# Patient Record
Sex: Female | Born: 2000
Health system: Southern US, Community
[De-identification: ages and names within clinical notes are randomized; demographics above are authoritative.]

## PROBLEM LIST (undated history)

## (undated) DIAGNOSIS — F32A Depression, unspecified: Secondary | ICD-10-CM

## (undated) DIAGNOSIS — J302 Other seasonal allergic rhinitis: Secondary | ICD-10-CM

## (undated) DIAGNOSIS — Z9889 Other specified postprocedural states: Secondary | ICD-10-CM

## (undated) DIAGNOSIS — F329 Major depressive disorder, single episode, unspecified: Secondary | ICD-10-CM

## (undated) DIAGNOSIS — J45909 Unspecified asthma, uncomplicated: Secondary | ICD-10-CM

## (undated) DIAGNOSIS — R112 Nausea with vomiting, unspecified: Secondary | ICD-10-CM

## (undated) DIAGNOSIS — F419 Anxiety disorder, unspecified: Secondary | ICD-10-CM

## (undated) DIAGNOSIS — G43909 Migraine, unspecified, not intractable, without status migrainosus: Secondary | ICD-10-CM

## (undated) HISTORY — PX: TONSILECTOMY, ADENOIDECTOMY, BILATERAL MYRINGOTOMY AND TUBES: SHX2538

## (undated) HISTORY — PX: OTHER SURGICAL HISTORY: SHX169

## (undated) HISTORY — PX: FOOT SURGERY: SHX648

## (undated) HISTORY — PX: TONSILLECTOMY AND ADENOIDECTOMY: SHX28

---
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
## 2019-02-27 ENCOUNTER — Other Ambulatory Visit: Payer: Self-pay | Admitting: Psychiatry

## 2019-02-27 DIAGNOSIS — F5082 Avoidant/restrictive food intake disorder: Secondary | ICD-10-CM

## 2019-02-27 DIAGNOSIS — F410 Panic disorder [episodic paroxysmal anxiety] without agoraphobia: Secondary | ICD-10-CM

## 2019-02-27 DIAGNOSIS — F902 Attention-deficit hyperactivity disorder, combined type: Secondary | ICD-10-CM

## 2019-02-27 DIAGNOSIS — F4312 Post-traumatic stress disorder, chronic: Secondary | ICD-10-CM

## 2019-02-28 NOTE — Telephone Encounter (Signed)
Now 3 months since last appointment needing 1 additional refill Pristiq 50 mg every morning #30 no refill for the interim to next appointment sent to [facility] [address] along with a renewal of the inhaler from 7 months ago provided at a time she was moving to [place] to have new providers, with no concern for inappropriate use or contraindication.

## 2019-02-28 NOTE — Telephone Encounter (Signed)
Has appt 06/11  Didn't think you would be refilling inhaler?

## 2019-03-04 ENCOUNTER — Other Ambulatory Visit: Payer: Self-pay

## 2019-03-04 ENCOUNTER — Encounter: Payer: Self-pay | Admitting: Emergency Medicine

## 2019-03-04 ENCOUNTER — Emergency Department
Admission: EM | Admit: 2019-03-04 | Discharge: 2019-03-04 | Disposition: A | Discharge status: Home / Self Care | Payer: 59 | Attending: Emergency Medicine | Admitting: Emergency Medicine

## 2019-03-04 DIAGNOSIS — Z79899 Other long term (current) drug therapy: Secondary | ICD-10-CM | POA: Diagnosis not present

## 2019-03-04 DIAGNOSIS — R112 Nausea with vomiting, unspecified: Secondary | ICD-10-CM | POA: Insufficient documentation

## 2019-03-04 DIAGNOSIS — F17290 Nicotine dependence, other tobacco product, uncomplicated: Secondary | ICD-10-CM | POA: Diagnosis not present

## 2019-03-04 DIAGNOSIS — R197 Diarrhea, unspecified: Secondary | ICD-10-CM | POA: Insufficient documentation

## 2019-03-04 DIAGNOSIS — J45909 Unspecified asthma, uncomplicated: Secondary | ICD-10-CM | POA: Insufficient documentation

## 2019-03-04 HISTORY — DX: Unspecified asthma, uncomplicated: J45.909

## 2019-03-04 LAB — COMPREHENSIVE METABOLIC PANEL
ALT: 17 U/L (ref 0–44)
AST: 29 U/L (ref 15–41)
Albumin: 5.3 g/dL — ABNORMAL HIGH (ref 3.5–5.0)
Alkaline Phosphatase: 92 U/L (ref 38–126)
Anion gap: 13 (ref 5–15)
BUN: 11 mg/dL (ref 6–20)
CO2: 20 mmol/L — ABNORMAL LOW (ref 22–32)
Calcium: 9.6 mg/dL (ref 8.9–10.3)
Chloride: 106 mmol/L (ref 98–111)
Creatinine, Ser: 0.85 mg/dL (ref 0.44–1.00)
GFR calc Af Amer: >60 mL/min (ref >60)
GFR calc non Af Amer: >60 mL/min (ref >60)
Glucose, Bld: 143 mg/dL — ABNORMAL HIGH (ref 70–99)
Potassium: 3.4 mmol/L — ABNORMAL LOW (ref 3.5–5.1)
Sodium: 139 mmol/L (ref 135–145)
Total Bilirubin: 1.2 mg/dL (ref 0.3–1.2)
Total Protein: 8.4 g/dL — ABNORMAL HIGH (ref 6.5–8.1)

## 2019-03-04 LAB — CBC
HCT: 45.8 % (ref 36.0–46.0)
Hemoglobin: 15.3 g/dL — ABNORMAL HIGH (ref 12.0–15.0)
MCH: 31.6 pg (ref 26.0–34.0)
MCHC: 33.4 g/dL (ref 30.0–36.0)
MCV: 94.6 fL (ref 80.0–100.0)
Platelets: 318 10*3/uL (ref 150–400)
RBC: 4.84 MIL/uL (ref 3.87–5.11)
RDW: 12 % (ref 11.5–15.5)
WBC: 12.7 10*3/uL — ABNORMAL HIGH (ref 4.0–10.5)
nRBC: 0 % (ref 0.0–0.2)

## 2019-03-04 LAB — LIPASE, BLOOD: Lipase: 32 U/L (ref 11–51)

## 2019-03-04 MED ORDER — ONDANSETRON HCL 4 MG/2ML IJ SOLN
4.0000 mg | Freq: Once | INTRAMUSCULAR | Status: AC | PRN
  Administered 2019-03-04: 4 mg via INTRAVENOUS
  Filled 2019-03-04: qty 2

## 2019-03-04 MED ORDER — ONDANSETRON 4 MG PO TBDP: 4.0000 mg | ORAL_TABLET | Freq: Three times a day (TID) | ORAL | 0 refills | Status: DC | PRN

## 2019-03-04 MED ORDER — PROMETHAZINE HCL 25 MG/ML IJ SOLN
25.0000 mg | Freq: Once | INTRAMUSCULAR | Status: AC
  Administered 2019-03-04: 25 mg via INTRAVENOUS

## 2019-03-04 MED ORDER — SODIUM CHLORIDE 0.9 % IV SOLN
Freq: Once | INTRAVENOUS | Status: AC
  Administered 2019-03-04: 18:00:00 via INTRAVENOUS

## 2019-03-04 NOTE — ED Provider Notes (Signed)
[facility] EMERGENCY DEPARTMENT Provider Note   CSN: [id number] Arrival date & time: 03/04/19  1624    History   Chief Complaint Chief Complaint  Patient presents with  . Emesis    HPI Ashley Chen is a 18 y.o. female.     18 year old female who presents to the emergency department for treatment and evaluation of vomiting and diarrhea which is triggering her anxiety. Symptoms started this morning. No alleviating measures attempted.     Past Medical History:  Diagnosis Date  . Anxiety   . Asthma   . Depression   . Migraine   . Seasonal allergies     Patient Active Problem List   Diagnosis Date Noted  . Panic disorder 09/06/2018  . 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 Attention deficit hyperactivity disorder (ADHD), combined type, 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  . Moderate malnutrition (HCC) 10/12/2017  . Needle phobia 10/12/2017  . Chronic post-traumatic stress disorder 06/11/2015  . 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 Menorrhagia 08/14/2014    Past Surgical History:  Procedure Laterality Date  . FOOT SURGERY    . labial surgery    . TONSILECTOMY, ADENOIDECTOMY, BILATERAL MYRINGOTOMY AND TUBES     before age 89  . TONSILLECTOMY AND ADENOIDECTOMY    . umblical hernia repair     before age 89     OB History    Gravida  0   Para  0   Term  0   Preterm  0   AB  0   Living  0     SAB  0   TAB  0   Ectopic  0   Multiple  0   Live Births  0            Home Medications    Prior to Admission medications   Medication Sig Start Date End Date Taking? Authorizing Provider  albuterol (VENTOLIN HFA) 108 (90 Base) MCG/ACT inhaler INHALE 1 TO 2 PUFFS INTO THE LUNGS EVERY 6 HOURS AS NEEDED FOR WHEEZING OR SHORTNESS OF BREATH. Patient taking differently: Inhale 1-2 puffs into the lungs every 6 (six) hours as needed for wheezing or shortness of breath.  02/28/19  Yes Chauncey MannJennings, Glenn E, MD  clonazePAM  (KLONOPIN) 0.5 MG tablet Take 1 tablet (0.5 mg total) by mouth 2 (two) times daily as needed for anxiety (Panic). 12/06/18  Yes Chauncey MannJennings, Glenn E, MD  desvenlafaxine (PRISTIQ) 50 MG 24 hr tablet TAKE 1 TABLET BY MOUTH EVERY DAY Patient taking differently: Take 50 mg by mouth daily.  02/28/19  Yes Chauncey MannJennings, Glenn E, MD  EPINEPHrine (EPIPEN 2-PAK) 0.3 mg/0.3 mL IJ SOAJ injection Inject 0.3 mg into the muscle once as needed (severe allergic reaction).   Yes [provider]  ibuprofen (ADVIL,MOTRIN) 200 MG tablet Take 600 mg by mouth every 6 (six) hours as needed for moderate pain or cramping.   Yes [provider]  levonorgestrel (MIRENA) 20 MCG/24HR IUD 1 each by Intrauterine route once. Implanted March or April 2016   Yes [provider]  lisdexamfetamine (VYVANSE) 50 MG capsule Take 1 capsule (50 mg total) by mouth daily for 30 days. 02/04/19 03/06/19 Yes Chauncey MannJennings, Glenn E, MD  mirtazapine (REMERON) 30 MG tablet Take 1 tablet (30 mg total) by mouth at bedtime. 12/06/18  Yes Chauncey MannJennings, Glenn E, MD  lisdexamfetamine (VYVANSE) 50 MG capsule Take 1 capsule (50 mg total) by mouth daily for 30 days. 12/06/18 01/05/19  Chauncey MannJennings, Glenn E, MD  lisdexamfetamine (VYVANSE) 50 MG capsule Take 1 capsule (50 mg total) by mouth daily for 30 days. 01/05/19 02/04/19  Delight Hoh, MD  ondansetron (ZOFRAN-ODT) 4 MG disintegrating tablet Take 1 tablet (4 mg total) by mouth every 8 (eight) hours as needed for nausea or vomiting. 03/04/19   Victorino Dike, FNP    Family History Family History  Problem Relation Age of Onset  . Drug abuse Mother   . Anxiety disorder Mother   . ADD / ADHD Brother   . Mental retardation Brother   . Depression Paternal Uncle   . Alcohol abuse Paternal Uncle     Social History Social History   Tobacco Use  . Smoking status: Current Every Day Smoker    Types: E-cigarettes  . Smokeless tobacco: Never Used  Substance Use Topics  . Alcohol use: Yes     Alcohol/week: 3.0 standard drinks    Types: 3 Shots of liquor per week    Comment: once a month  . Drug use: Yes    Types: Marijuana    Comment: xanax last use in April 2018; marijuana use weekly as of 09/16/17     Allergies   Horse-derived products; Lactose intolerance (gi); and Other   Review of Systems Review of Systems  Constitutional: Positive for appetite change and chills. Negative for fatigue and fever.  HENT: Negative for sore throat.   Eyes: Negative.   Respiratory: Negative.   Cardiovascular: Negative for chest pain.  Gastrointestinal: Positive for diarrhea and vomiting. Negative for abdominal pain.  Genitourinary: Negative for decreased urine volume, dysuria, flank pain and frequency.  Musculoskeletal: Negative.   Skin: Positive for pallor.  Neurological: Negative for dizziness, speech difficulty and headaches.  Psychiatric/Behavioral: Negative for suicidal ideas. The patient is nervous/anxious.      Physical Exam Updated Vital Signs BP 139/66   Pulse 95   Temp (!) 97.5 F (36.4 C) (Oral)   Resp 20   Ht 5\' 2"  (1.575 m)   Wt 47.6 kg   SpO2 100%   BMI 19.20 kg/m   Physical Exam Constitutional:      General: She is not in acute distress.    Appearance: She is ill-appearing.  HENT:     Head: Normocephalic.     Nose: No rhinorrhea.     Mouth/Throat:     Mouth: Mucous membranes are moist.  Neck:     Musculoskeletal: Neck supple.  Cardiovascular:     Rate and Rhythm: Normal rate.  Pulmonary:     Breath sounds: Normal breath sounds.  Abdominal:     General: Abdomen is flat.     Tenderness: There is no abdominal tenderness. There is no guarding or rebound.  Skin:    General: Skin is warm and dry.     Capillary Refill: Capillary refill takes less than 2 seconds.     Coloration: Skin is pale.  Neurological:     General: No focal deficit present.  Psychiatric:        Mood and Affect: Mood is anxious.        Speech: Speech normal.        Behavior:  Behavior is cooperative.        Cognition and Memory: Cognition normal.        Judgment: Judgment normal.      ED Treatments / Results  Labs (all labs ordered are listed, but only abnormal results are displayed) Labs Reviewed  COMPREHENSIVE METABOLIC PANEL - Abnormal; Notable for the following  components:      Result Value   Potassium 3.4 (*)    CO2 20 (*)    Glucose, Bld 143 (*)    Total Protein 8.4 (*)    Albumin 5.3 (*)    All other components within normal limits  CBC - Abnormal; Notable for the following components:   WBC 12.7 (*)    Hemoglobin 15.3 (*)    All other components within normal limits  LIPASE, BLOOD  URINALYSIS, COMPLETE (UACMP) WITH MICROSCOPIC  POC URINE PREG, ED    EKG None  Radiology No results found.  Procedures Procedures (including critical care time)  Medications Ordered in ED Medications  ondansetron (ZOFRAN) injection 4 mg (4 mg Intravenous Given 03/04/19 1639)  promethazine (PHENERGAN) injection 25 mg (25 mg Intravenous Given 03/04/19 1731)  0.9 %  sodium chloride infusion ( Intravenous Stopped 03/04/19 1857)     Initial Impression / Assessment and Plan / ED Course  I have reviewed the triage vital signs and the nursing notes.  Pertinent labs & imaging results that were available during my care of the patient were reviewed by me and considered in my medical decision making (see chart for details).  18 year old female presenting to the emergency department for treatment and evaluation of vomiting and diarrhea.  Symptoms started suddenly this morning.  No one else in the home is ill.  She also states that because of the vomiting and diarrhea her anxiety is extremely bad.  She has been unable to take her anxiety medications due to nausea and vomiting.  She will be given some IV fluids as well as some Zofran and will hopefully be able to tolerate her oral anxiety medications.  Patient continues to complain of nausea and has had one episode of  nonbilious emesis since initial exam.  IV Phenergan will be given.  She remains extremely anxious and is hyperventilating.  She was encouraged to concentrate on breathing and was offered distraction with TV which she declined.  IV fluids infusing.  Patient's skin tone has improved and she is no longer as pale.  She continues to complain of some nausea but states that she has improved some what.  She has not vomited since I was last in the room.  She was able to take her own home anxiety meds.  If she keeps those down, she will be discharged home with Rx for zofran.  Patient requesting discharge. Prescription for zofran printed. She is to return to the ER for symptoms that change or worsen or for new concerns.   Final Clinical Impressions(s) / ED Diagnoses   Final diagnoses:  Nausea vomiting and diarrhea    ED Discharge Orders         Ordered    ondansetron (ZOFRAN-ODT) 4 MG disintegrating tablet  Every 8 hours PRN     03/04/19 1945           Chinita Pesterriplett, Aubri Gathright B, FNP 03/04/19 1949    Sharman CheekStafford, Phillip, MD 03/04/19 2349

## 2019-03-04 NOTE — Discharge Instructions (Signed)
Clear liquids only for the next several hours, then progress to bland foods such as saltine crackers, plain rice, applesauce, and toast.  Return to the ER for symptoms of concern.

## 2019-03-04 NOTE — ED Triage Notes (Signed)
Pt in for n/v since 10am.

## 2019-03-04 NOTE — ED Notes (Signed)
Pt started on 2nd liter of normal saline.

## 2019-03-05 ENCOUNTER — Telehealth: Payer: Self-pay | Admitting: Physician Assistant

## 2019-03-05 NOTE — Telephone Encounter (Signed)
Appreciate assistance of on-call after-hours, patient last seen 3 months ago in the office prior to stay at home coronavirus which may be the source of her anxiety also. She was concluded in the emergency department to have viral gastroenteritis as a trigger for anxiety making no change in her psychiatric status or treatment but IV fluids and Zofran given due follow-up in the office soon from last appointment.

## 2019-03-05 NOTE — Telephone Encounter (Signed)
Mom called yesterday, 03/04/19.  Pt was having increased anxiety and CP several times in the past week. EMS was called.  Pt at [facility] ER.  Mom asked if I could do anything to allow her to be w/ [place].  Unfortunately I can't b/c COVID restrictions. At the time we talked, there were no plans for admission, as far as mom knew.  Pt has appt w/ you 03/08/19.

## 2019-03-08 ENCOUNTER — Ambulatory Visit (INDEPENDENT_AMBULATORY_CARE_PROVIDER_SITE_OTHER): Payer: 59 | Admitting: Psychiatry

## 2019-03-08 ENCOUNTER — Encounter: Payer: Self-pay | Admitting: Psychiatry

## 2019-03-08 ENCOUNTER — Other Ambulatory Visit: Payer: Self-pay

## 2019-03-08 VITALS — Ht 64.0 in | Wt 103.0 lb

## 2019-03-08 DIAGNOSIS — F4312 Post-traumatic stress disorder, chronic: Secondary | ICD-10-CM

## 2019-03-08 DIAGNOSIS — F902 Attention-deficit hyperactivity disorder, combined type: Secondary | ICD-10-CM | POA: Diagnosis not present

## 2019-03-08 DIAGNOSIS — F410 Panic disorder [episodic paroxysmal anxiety] without agoraphobia: Secondary | ICD-10-CM

## 2019-03-08 DIAGNOSIS — F332 Major depressive disorder, recurrent severe without psychotic features: Secondary | ICD-10-CM

## 2019-03-08 DIAGNOSIS — F5082 Avoidant/restrictive food intake disorder: Secondary | ICD-10-CM

## 2019-03-08 MED ORDER — MIRTAZAPINE 30 MG PO TABS: 30.0000 mg | ORAL_TABLET | Freq: Every day | ORAL | 1 refills | Status: DC

## 2019-03-08 MED ORDER — ALPRAZOLAM 1 MG PO TABS: 1.0000 mg | ORAL_TABLET | Freq: Three times a day (TID) | ORAL | 1 refills | Status: DC | PRN

## 2019-03-08 MED ORDER — QUETIAPINE FUMARATE 50 MG PO TABS: 50.0000 mg | ORAL_TABLET | Freq: Every day | ORAL | 1 refills | Status: DC
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
## 2019-03-16 ENCOUNTER — Telehealth: Payer: Self-pay | Admitting: Psychiatry

## 2019-03-16 NOTE — Telephone Encounter (Signed)
Received after hours call from pt who reports that she may have had seizure activity and recently started a new medication. Pt reports that she started Seroquel on 03/08/19 and felt "weird" on 50 mg and then reduced dose to 25 mg. She reports that she continued to groggy upon awakening. She reports that this evening she experienced an episode where she felt she could not move or make sounds. She reports that she thought she was talking but her boyfriend said she did not make a sound. She reports that he did not witness any convulsions or other changes in behavior. She reports that her legs felt week after this episode and both her arms and legs were shaking. She denies any other s/s to include HA or dizziness. She denies any other similar episodes in the past.   Case staffed with Dr. Creig Hines. Discussed that episode is unlikely related to Seroquel. Discussed that she could stop Seroquel if she is unable to tolerate. Recommended not taking Xanax until physical s/s improve and stabilize. Discussed that she may wish to contact her PCP if physical s/s persist or seek urgent medical treatment if s/s worsen.

## 2019-03-16 NOTE — Telephone Encounter (Signed)
Patient phoned on-call provider from boyfriend's car that she was having unidentifiable seizures, paralysis unable to walk, and memory loss as though syncopal. Patient  attributed this to Seroquel even though at reduced dose down from newly started 50 to 25 mg added to Remeron 30 mg down from self-imposed double dosing nightly to force weight gain and Pristiq 50 mg every morning.  Patient has been trying to gain weight and may be restricting or tanking on fluids in response, going to the ED recently before last appointment where they gave her 3 L IVF for gastroenteritis.  Mother had been treating the patient's panic anxiety with mother's own alprazolam as the patient's clonazepam was not helpful according to patient, therefore the alprazolam was added as needed in place of clonazepam.  Patient symptoms are more likely from Xanax use or misuse combined with eating disorder, possibly substance use, or possibly somatoform. She will discontinue Seroquel and limit herself to Pristiq 50 mg in the morning and Remeron 30 mg at night not taking any further Xanax or Vyvanse until medically cleared with documented medical stability to do so.  She was surprised by that suggestion by the on-call provider stating quickly that she was back to normal and could take her Xanax for panic without problem.  On call instructed her to stop the Xanax, and I left message for pharmacist @ [facility] [address] to cancel Xanax, Seroquel, and Vyvanse refill remaining, leaving message for pharmacist as they are closed after hours.

## 2019-03-18 ENCOUNTER — Telehealth: Payer: Self-pay | Admitting: Psychiatry

## 2019-03-18 NOTE — Telephone Encounter (Signed)
Received after hours call from pt who reports that her muscles have been sore throughout the day, she feels "clumsy, and my taste buds feel different." She reports that she feels better overall today compared to yesterday evening. She reports that she did not take Seroquel last night. She reports that she did take Xanax last night.   Reiterated that reports physical s/s are likely not related to medication and that she may wish to f/u with PCP or consider going to urgent care or the ER if s/s worsen. Also reminded pt of previous recommendation not to take any additional Xanax until physical s/s improve.

## 2019-03-30 ENCOUNTER — Other Ambulatory Visit: Payer: Self-pay | Admitting: Psychiatry

## 2019-03-30 DIAGNOSIS — F332 Major depressive disorder, recurrent severe without psychotic features: Secondary | ICD-10-CM

## 2019-04-06 ENCOUNTER — Other Ambulatory Visit: Payer: Self-pay | Admitting: Psychiatry

## 2019-04-06 ENCOUNTER — Telehealth: Payer: Self-pay | Admitting: Psychiatry

## 2019-04-06 DIAGNOSIS — F4312 Post-traumatic stress disorder, chronic: Secondary | ICD-10-CM

## 2019-04-06 DIAGNOSIS — F410 Panic disorder [episodic paroxysmal anxiety] without agoraphobia: Secondary | ICD-10-CM

## 2019-04-06 MED ORDER — ALPRAZOLAM 0.5 MG PO TABS: 0.5000 mg | ORAL_TABLET | Freq: Three times a day (TID) | ORAL | 0 refills | Status: DC | PRN
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
## 2019-04-06 NOTE — Telephone Encounter (Signed)
Pt tried to go to pharmacy to pickup alprazolam and they told her that the rx was invalid. [facility] uses [facility].

## 2019-05-03 ENCOUNTER — Other Ambulatory Visit: Payer: Self-pay | Admitting: Psychiatry

## 2019-05-03 DIAGNOSIS — F902 Attention-deficit hyperactivity disorder, combined type: Secondary | ICD-10-CM

## 2019-05-03 NOTE — Telephone Encounter (Signed)
Are you prescribing her inhaler?

## 2019-05-03 NOTE — Telephone Encounter (Signed)
Improved in her weight but disorganized in her responsibility using nitrous oxide and overusing Xanax as she complained of panic but her disruptiveness made panic more likely, she responded to reduction in the Xanax expecting her to either improve and restructure treatment here or to terminate treatment here if she is to continue that style needing a fresh start new setting.  Last Xanax was 04/06/2019 filled for #60 per [place] registry as 0.5 mg tablet 2 or 3 times daily as needed.  She has not requested more Xanax but reported using the inhaler when she was about the panic as a prescription she requested when not having a PCP in transition from adolescent to early adult life.  I will refill the albuterol inhaler for her for these reason.

## 2019-05-06 ENCOUNTER — Other Ambulatory Visit: Payer: Self-pay | Admitting: Psychiatry

## 2019-05-07 NOTE — Telephone Encounter (Signed)
Has appt tomorrow 08/11

## 2019-05-08 ENCOUNTER — Encounter: Payer: Self-pay | Admitting: Psychiatry

## 2019-05-08 ENCOUNTER — Ambulatory Visit (INDEPENDENT_AMBULATORY_CARE_PROVIDER_SITE_OTHER): Payer: 59 | Admitting: Psychiatry

## 2019-05-08 ENCOUNTER — Other Ambulatory Visit: Payer: Self-pay

## 2019-05-08 VITALS — Ht 64.0 in | Wt 132.0 lb

## 2019-05-08 DIAGNOSIS — F410 Panic disorder [episodic paroxysmal anxiety] without agoraphobia: Secondary | ICD-10-CM

## 2019-05-08 DIAGNOSIS — F902 Attention-deficit hyperactivity disorder, combined type: Secondary | ICD-10-CM

## 2019-05-08 DIAGNOSIS — F5082 Avoidant/restrictive food intake disorder: Secondary | ICD-10-CM

## 2019-05-08 DIAGNOSIS — F4312 Post-traumatic stress disorder, chronic: Secondary | ICD-10-CM

## 2019-05-08 DIAGNOSIS — F332 Major depressive disorder, recurrent severe without psychotic features: Secondary | ICD-10-CM

## 2019-05-08 MED ORDER — DESVENLAFAXINE SUCCINATE ER 50 MG PO TB24: 50.0000 mg | ORAL_TABLET | Freq: Every day | ORAL | 0 refills | Status: DC

## 2019-05-08 MED ORDER — MIRTAZAPINE 15 MG PO TABS: 15.0000 mg | ORAL_TABLET | Freq: Every day | ORAL | 0 refills | Status: DC

## 2019-05-08 MED ORDER — ALPRAZOLAM 0.5 MG PO TABS: 0.5000 mg | ORAL_TABLET | Freq: Three times a day (TID) | ORAL | 2 refills | Status: DC | PRN
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
## 2019-05-22 ENCOUNTER — Other Ambulatory Visit: Payer: Self-pay | Admitting: Psychiatry

## 2019-05-22 DIAGNOSIS — F902 Attention-deficit hyperactivity disorder, combined type: Secondary | ICD-10-CM

## 2019-05-22 NOTE — Telephone Encounter (Signed)
Refill 08/06

## 2019-05-22 NOTE — Telephone Encounter (Signed)
After abuse of whippets over 100 daily, patient must not establish a similar pattern with her inhaler.  She has had no PCP going to the ED when taking medical care.  Will provide 1 additional albuterol inhaler now only 3 weeks since last fill in case she has lost that supply.

## 2019-06-19 ENCOUNTER — Other Ambulatory Visit: Payer: Self-pay | Admitting: Psychiatry

## 2019-06-19 DIAGNOSIS — F902 Attention-deficit hyperactivity disorder, combined type: Secondary | ICD-10-CM

## 2019-06-19 NOTE — Telephone Encounter (Signed)
appt 07/09/2019

## 2019-06-19 NOTE — Telephone Encounter (Signed)
Last albuterol was 05/22/2019 suggests the use of the rescue inhaler similar to whippets in the past.  Last refill possible by psychiatry as patient must obtain a new regimen from a primary care.

## 2019-07-09 ENCOUNTER — Encounter: Payer: Self-pay | Admitting: Psychiatry

## 2019-07-09 ENCOUNTER — Other Ambulatory Visit: Payer: Self-pay

## 2019-07-09 ENCOUNTER — Ambulatory Visit (INDEPENDENT_AMBULATORY_CARE_PROVIDER_SITE_OTHER): Payer: 59 | Admitting: Psychiatry

## 2019-07-09 VITALS — Ht 64.0 in | Wt 138.0 lb

## 2019-07-09 DIAGNOSIS — F332 Major depressive disorder, recurrent severe without psychotic features: Secondary | ICD-10-CM

## 2019-07-09 DIAGNOSIS — F4312 Post-traumatic stress disorder, chronic: Secondary | ICD-10-CM | POA: Diagnosis not present

## 2019-07-09 DIAGNOSIS — F5082 Avoidant/restrictive food intake disorder: Secondary | ICD-10-CM | POA: Diagnosis not present

## 2019-07-09 DIAGNOSIS — F410 Panic disorder [episodic paroxysmal anxiety] without agoraphobia: Secondary | ICD-10-CM

## 2019-07-09 DIAGNOSIS — F902 Attention-deficit hyperactivity disorder, combined type: Secondary | ICD-10-CM | POA: Diagnosis not present

## 2019-07-09 MED ORDER — MIRTAZAPINE 30 MG PO TABS: 30.0000 mg | ORAL_TABLET | Freq: Every day | ORAL | 0 refills | Status: DC

## 2019-07-09 MED ORDER — DESVENLAFAXINE SUCCINATE ER 50 MG PO TB24: 50.0000 mg | ORAL_TABLET | Freq: Every day | ORAL | 0 refills | Status: DC

## 2019-07-09 MED ORDER — ALPRAZOLAM 0.5 MG PO TABS: 0.5000 mg | ORAL_TABLET | Freq: Three times a day (TID) | ORAL | 1 refills | Status: DC | PRN
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
## 2019-08-13 ENCOUNTER — Other Ambulatory Visit: Payer: Self-pay

## 2019-08-13 ENCOUNTER — Encounter: Payer: Self-pay | Admitting: Emergency Medicine

## 2019-08-13 ENCOUNTER — Ambulatory Visit
Admission: EM | Admit: 2019-08-13 | Discharge: 2019-08-13 | Disposition: A | Discharge status: Home / Self Care | Payer: 59 | Attending: Emergency Medicine | Admitting: Emergency Medicine

## 2019-08-13 DIAGNOSIS — R05 Cough: Secondary | ICD-10-CM | POA: Diagnosis not present

## 2019-08-13 DIAGNOSIS — R059 Cough, unspecified: Secondary | ICD-10-CM

## 2019-08-13 DIAGNOSIS — Z20822 Contact with and (suspected) exposure to covid-19: Secondary | ICD-10-CM

## 2019-08-13 DIAGNOSIS — Z20828 Contact with and (suspected) exposure to other viral communicable diseases: Secondary | ICD-10-CM

## 2019-08-13 LAB — POC SARS CORONAVIRUS 2 AG -  ED: SARS Coronavirus 2 Ag: NEGATIVE

## 2019-08-13 MED ORDER — FLUCONAZOLE 200 MG PO TABS: 200.0000 mg | ORAL_TABLET | Freq: Once | ORAL | 0 refills | Status: AC

## 2019-08-13 MED ORDER — AMOXICILLIN 500 MG PO CAPS: 500.0000 mg | ORAL_CAPSULE | Freq: Three times a day (TID) | ORAL | 0 refills | Status: DC

## 2019-08-13 NOTE — Discharge Instructions (Signed)
Your COVID test is pending - it is important to quarantine / isolate at home until your results are back. °If you test positive and would like further evaluation for persistent or worsening symptoms, you may schedule an E-visit or virtual (video) visit throughout the  MyChart app or website. ° °PLEASE NOTE: If you develop severe chest pain or shortness of breath please go to the ER or call 9-1-1 for further evaluation --> DO NOT schedule electronic or virtual visits for this. °Please call our office for further guidance / recommendations as needed. °

## 2019-08-13 NOTE — ED Triage Notes (Addendum)
Pt presents to [facility] for assessment of sore throat, cough, diarrhea, emesis, chills, denies fever, or abdominal pain  6 days

## 2019-08-13 NOTE — ED Notes (Signed)
Patient able to ambulate independently  

## 2019-08-13 NOTE — ED Provider Notes (Signed)
[facility]    CSN: [id number] Arrival date & time: 08/13/19  1701      History   Chief Complaint Chief Complaint  Patient presents with  . COVID-Like Symptoms    HPI Ashley Chen is a 18 y.o. female with history of allergies, depression, asthma, anxiety presenting for 6-day course of Covid-like symptoms.  Symptoms including sore throat, cough, diarrhea, vomiting, chills.  States cough is productive, non-hemoptic.  Last emesis was yesterday morning: no blood/bile - no nausea. Having 5-6 BM daily: no blood/melena.  Patient denies fever, abdominal pain.  No nausea today.  Denies known sick contacts.  Has tried day/nyquil, Theraflu, APAP, ibuprofen with mild relief.  Past Medical History:  Diagnosis Date  . Anxiety   . Asthma   . Depression   . Migraine   . Seasonal allergies     Patient Active Problem List   Diagnosis Date Noted  . Panic disorder 09/06/2018  . 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 Attention deficit hyperactivity disorder (ADHD), combined type, 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  . Moderate malnutrition ([place]) 10/12/2017  . Needle phobia 10/12/2017  . Chronic post-traumatic stress disorder 06/11/2015  . 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 Menorrhagia 08/14/2014    Past Surgical History:  Procedure Laterality Date  . FOOT SURGERY    . labial surgery    . TONSILECTOMY, ADENOIDECTOMY, BILATERAL MYRINGOTOMY AND TUBES     before age 63  . TONSILLECTOMY AND ADENOIDECTOMY    . umblical hernia repair     before age 76    OB History    Gravida  0   Para  0   Term  0   Preterm  0   AB  0   Living  0     SAB  0   TAB  0   Ectopic  0   Multiple  0   Live Births  0            Home Medications    Prior to Admission medications   Medication Sig Start Date End Date Taking? Authorizing Provider  albuterol (VENTOLIN HFA) 108 (90 Base) MCG/ACT inhaler INHALE 1-2 PUFFS INTO THE LUNGS EVERY 6 HOURS AS NEEDED FOR  WHEEZING/SHORTNESS OF BREATH 06/19/19   Delight Hoh, MD  ALPRAZolam Duanne Moron) 0.5 MG tablet Take 1 tablet (0.5 mg total) by mouth 3 (three) times daily as needed for anxiety (panic). 07/09/19   Delight Hoh, MD  amoxicillin (AMOXIL) 500 MG capsule Take 1 capsule (500 mg total) by mouth 3 (three) times daily. 08/13/19   Hall-Potvin, [place], PA-C  desvenlafaxine (PRISTIQ) 50 MG 24 hr tablet Take 1 tablet (50 mg total) by mouth at bedtime. 07/09/19   Delight Hoh, MD  EPINEPHrine (EPIPEN 2-PAK) 0.3 mg/0.3 mL IJ SOAJ injection Inject 0.3 mg into the muscle once as needed (severe allergic reaction).    [provider]  ibuprofen (ADVIL,MOTRIN) 200 MG tablet Take 600 mg by mouth every 6 (six) hours as needed for moderate pain or cramping.    [provider]  levonorgestrel (MIRENA) 20 MCG/24HR IUD 1 each by Intrauterine route once. Implanted March or April 2016    [provider]  mirtazapine (REMERON) 30 MG tablet Take 1 tablet (30 mg total) by mouth at bedtime. 07/09/19   Delight Hoh, MD  ondansetron (ZOFRAN-ODT) 4 MG disintegrating tablet Take 1 tablet (4 mg total) by mouth every 8 (eight) hours as needed for nausea or vomiting. 03/04/19  Triplett, Kasandra Knudsen, FNP    Family History Family History  Problem Relation Age of Onset  . Drug abuse Mother   . Anxiety disorder Mother   . ADD / ADHD Brother   . Mental retardation Brother   . Depression Paternal Uncle   . Alcohol abuse Paternal Uncle     Social History Social History   Tobacco Use  . Smoking status: Current Every Day Smoker    Types: E-cigarettes  . Smokeless tobacco: Never Used  Substance Use Topics  . Alcohol use: Yes    Alcohol/week: 3.0 standard drinks    Types: 3 Shots of liquor per week    Comment: once a month  . Drug use: Yes    Types: Marijuana, Nitrous oxide    Comment: xanax last use in April 2018; marijuana use weekly as of 09/16/17     Allergies   Horse-derived  products, Lactose intolerance (gi), and Other   Review of Systems Review of Systems  Constitutional: Positive for fatigue. Negative for activity change, appetite change and fever.  HENT: Positive for sore throat. Negative for congestion, ear pain, sinus pain and voice change.   Eyes: Negative for pain, redness and visual disturbance.  Respiratory: Positive for cough. Negative for shortness of breath and wheezing.   Cardiovascular: Negative for chest pain and palpitations.  Gastrointestinal: Positive for diarrhea and vomiting. Negative for abdominal pain, blood in stool, constipation and nausea.  Musculoskeletal: Negative for arthralgias and myalgias.  Skin: Negative for rash and wound.  Neurological: Negative for syncope and headaches.     Physical Exam Triage Vital Signs ED Triage Vitals  Enc Vitals Group     BP 08/13/19 1716 131/80     Pulse Rate 08/13/19 1716 92     Resp 08/13/19 1716 18     Temp 08/13/19 1716 98.2 F (36.8 C)     Temp Source 08/13/19 1716 Temporal     SpO2 08/13/19 1716 98 %     Weight --      Height --      Head Circumference --      Peak Flow --      Pain Score 08/13/19 1718 8     Pain Loc --      Pain Edu? --      Excl. in GC? --    No data found.  Updated Vital Signs BP 131/80 (BP Location: Left Arm)   Pulse 92   Temp 98.2 F (36.8 C) (Temporal) Comment: Dayquil at 10am,  Resp 18   SpO2 98%   Visual Acuity Right Eye Distance:   Left Eye Distance:   Bilateral Distance:    Right Eye Near:   Left Eye Near:    Bilateral Near:     Physical Exam Constitutional:      General: She is not in acute distress. HENT:     Head: Normocephalic and atraumatic.  Eyes:     General: No scleral icterus.    Pupils: Pupils are equal, round, and reactive to light.  Cardiovascular:     Rate and Rhythm: Normal rate and regular rhythm.  Pulmonary:     Effort: Pulmonary effort is normal. No respiratory distress.     Breath sounds: Rales present. No  wheezing or rhonchi.     Comments: Left lower lobe rales Skin:    Coloration: Skin is not jaundiced or pale.  Neurological:     Mental Status: She is alert and oriented to person, place, and time.  UC Treatments / Results  Labs (all labs ordered are listed, but only abnormal results are displayed) Labs Reviewed  NOVEL CORONAVIRUS, NAA   Narrative:    Performed at:  [address] - [facility] [address], [place], [place]  [id number] Lab Director: Jolene SchimkeSanjai Nagendra MD, Phone:  [phone]  POC SARS CORONAVIRUS 2 ED    EKG   Radiology No results found.  Procedures Procedures (including critical care time)  Medications Ordered in UC Medications - No data to display  Initial Impression / Assessment and Plan / UC Course  I have reviewed the triage vital signs and the nursing notes.  Pertinent labs & imaging results that were available during my care of the patient were reviewed by me and considered in my medical decision making (see chart for details).     POC Covid in office negative, PCR pending.  Patient afebrile, nontoxic in office.  Given history and clinical findings of left lower lobe rales will treat outpatient for pneumonia.  Chart review done by me showing prolonged QTC (458) on EKG December 2018: We will avoid azithromycin.  Patient reports history of yeast infections status post antibiotic use: Diflucan sent.  Return precautions discussed, patient verbalized understanding and is agreeable to plan. Final Clinical Impressions(s) / UC Diagnoses   Final diagnoses:  Suspected COVID-19 virus infection  Cough     Discharge Instructions     Your COVID test is pending - it is important to quarantine / isolate at home until your results are back. If you test positive and would like further evaluation for persistent or worsening symptoms, you may schedule an E-visit or virtual (video) visit throughout the [facility] MyChart app or website.  PLEASE NOTE: If you  develop severe chest pain or shortness of breath please go to the ER or call 9-1-1 for further evaluation --> DO NOT schedule electronic or virtual visits for this. Please call our office for further guidance / recommendations as needed.    ED Prescriptions    Medication Sig Dispense Auth. Provider   amoxicillin (AMOXIL) 500 MG capsule Take 1 capsule (500 mg total) by mouth 3 (three) times daily. 21 capsule Hall-Potvin, [place], PA-C   fluconazole (DIFLUCAN) 200 MG tablet Take 1 tablet (200 mg total) by mouth once for 1 dose. May repeat in 72 hours if needed 2 tablet Hall-Potvin, [place], PA-C     PDMP not reviewed this encounter.   Hall-Potvin, [place], [place]C 08/16/19 [id number]

## 2019-08-15 LAB — NOVEL CORONAVIRUS, NAA: SARS-CoV-2, NAA: NOT DETECTED

## 2019-09-10 ENCOUNTER — Other Ambulatory Visit: Payer: Self-pay

## 2019-09-10 ENCOUNTER — Ambulatory Visit (INDEPENDENT_AMBULATORY_CARE_PROVIDER_SITE_OTHER): Payer: 59 | Admitting: Psychiatry

## 2019-09-10 ENCOUNTER — Encounter: Payer: Self-pay | Admitting: Psychiatry

## 2019-09-10 VITALS — Ht 64.0 in | Wt 143.0 lb

## 2019-09-10 DIAGNOSIS — F902 Attention-deficit hyperactivity disorder, combined type: Secondary | ICD-10-CM | POA: Diagnosis not present

## 2019-09-10 DIAGNOSIS — F4312 Post-traumatic stress disorder, chronic: Secondary | ICD-10-CM | POA: Diagnosis not present

## 2019-09-10 DIAGNOSIS — F410 Panic disorder [episodic paroxysmal anxiety] without agoraphobia: Secondary | ICD-10-CM | POA: Diagnosis not present

## 2019-09-10 DIAGNOSIS — F5082 Avoidant/restrictive food intake disorder: Secondary | ICD-10-CM

## 2019-09-10 MED ORDER — DESVENLAFAXINE SUCCINATE ER 50 MG PO TB24: 50.0000 mg | ORAL_TABLET | Freq: Every day | ORAL | 0 refills | Status: DC

## 2019-09-10 MED ORDER — ALPRAZOLAM 0.5 MG PO TABS: 0.5000 mg | ORAL_TABLET | Freq: Three times a day (TID) | ORAL | 1 refills | Status: DC | PRN

## 2019-09-10 MED ORDER — MIRTAZAPINE 15 MG PO TABS: 15.0000 mg | ORAL_TABLET | Freq: Every day | ORAL | 0 refills | Status: DC

## 2019-09-10 MED ORDER — HYDROXYZINE PAMOATE 50 MG PO CAPS: 50.0000 mg | ORAL_CAPSULE | Freq: Every evening | ORAL | 0 refills | Status: DC | PRN

## 2019-09-10 NOTE — Progress Notes (Signed)
[facility]  Patient ID: Meerab Maselli,  MRN: [id number]  PCP: Judithann Sauger, MD  Date of Evaluation: 09/10/2019 Time spent:20 minutes from 1540 to 1600  Chief Complaint:  Chief Complaint    Anxiety; Trauma; Panic Attack; ADHD; Eating Disorder      HISTORY/CURRENT STATUS: Evanell is seen onsite in office face-to-face 20 minutes individually with consent with epic collateral for adolescent psychiatric interview and exam in 25-month evaluation and management of ADHD, PTSD, panic disorder and ARFID.  As anxiety has been significantly contained and substance use reduced or stopped, the patient's eating disorder has significantly improved.  She attributes her improvement to Remeron titrated up to 30 mg nightly now gaining 5 pounds in the last 2 months with weight overall restored.  Patient has the ambivalent stressor and loss in the interim of ex-boyfriend dying in a car wreck the night before Thanksgiving after having relatively facilitated the patient's reduction in drug use especially whippets but then after they broke up impregnating the patient's friend and choosing another as godmother.  Patient also in the interim has pneumonia when she had previously used albuterol excessively seeking refills through this office after stopping the whippets.  She is now on amoxicillin and will have rex-ray December 22.  She has been changed to Ventolin nebulizer and is considered to be allergic to the chickens next-door as well as the family puppy.  She did require more Xanax at the time of the death of ex-boyfriend.  She still takes hydroxyzine and is now willing to reduce her Remeron while continuing Pristiq.  She has no suicidality, mania, psychosis or delirium.   Depression The patient presents withexacerbationofdepression more than 5 month ago dailyimproving with stabilization of substance use, nutrition, and anxiety.The problem has been waxing and waningsince  onset.Associated symptoms includedecreased concentration, fatigue, decreased interest,grief and loss, and worry.Associated symptoms include nobody aches, insomnia, headaches, appetite change, indigestion, helplessness,hopelessness, psychosis,  or suicidality.The symptoms are aggravated by family issues, work stress, social issues and medication.Past treatments include other medications, psychotherapy and SNRIs - Serotonin and norepinephrine reuptake inhibitors.Compliance with treatment is variable.Past compliance problems include difficulty understanding directions, difficulty with treatment plan, medication issues and medical issues.Previous treatment provided mildrelief.Risk factors include family history, family history of mental illness, a change in medication usage/dosage, intravenous drug abuse, substance abuse and history of suicide attempt. Past medical history includes hypothyroidism,recent illness,recent psychiatric admission,anxiety,mental health disorder,post-traumatic stress disorderand suicide attempts. Pertinent negatives include no chronic pain,no chronic illness,no life-threatening condition,no physical disability,no brain trauma,no bipolar disorder,no depression,no obsessive-compulsive disorder,no schizophreniaand no head trauma.  Individual Medical History/ Review of Systems: Changes? :Yes Pneumonia possibly complicating asthma for family puppy and chickens next-door treated now with Ventolin nebulizer and amoxicillin to have repeat chest x-ray December 22  Allergies: Horse-derived products, Lactose intolerance (gi), and Other  Current Medications:  Current Outpatient Medications:  .  albuterol (VENTOLIN HFA) 108 (90 Base) MCG/ACT inhaler, INHALE 1-2 PUFFS INTO THE LUNGS EVERY 6 HOURS AS NEEDED FOR WHEEZING/SHORTNESS OF BREATH, Disp: 6.7 g, Rfl: 0 .  ALPRAZolam (XANAX) 0.5 MG tablet, Take 1 tablet (0.5 mg total) by mouth 3 (three) times daily  as needed for anxiety (panic)., Disp: 90 tablet, Rfl: 1 .  amoxicillin (AMOXIL) 500 MG capsule, Take 1 capsule (500 mg total) by mouth 3 (three) times daily., Disp: 21 capsule, Rfl: 0 .  desvenlafaxine (PRISTIQ) 50 MG 24 hr tablet, Take 1 tablet (50 mg total) by mouth at bedtime., Disp: 90 tablet, Rfl: 0 .  EPINEPHrine (  EPIPEN 2-PAK) 0.3 mg/0.3 mL IJ SOAJ injection, Inject 0.3 mg into the muscle once as needed (severe allergic reaction)., Disp: , Rfl:  .  ibuprofen (ADVIL,MOTRIN) 200 MG tablet, Take 600 mg by mouth every 6 (six) hours as needed for moderate pain or cramping., Disp: , Rfl:  .  levonorgestrel (MIRENA) 20 MCG/24HR IUD, 1 each by Intrauterine route once. Implanted March or April 2016, Disp: , Rfl:  .  mirtazapine (REMERON) 30 MG tablet, Take 1 tablet (30 mg total) by mouth at bedtime., Disp: 90 tablet, Rfl: 0 .  ondansetron (ZOFRAN-ODT) 4 MG disintegrating tablet, Take 1 tablet (4 mg total) by mouth every 8 (eight) hours as needed for nausea or vomiting., Disp: 20 tablet, Rfl: 0   Medication Side Effects: weight gain  Family Medical/ Social History: Changes? No  MENTAL HEALTH EXAM:  Height 5\' 4"  (1.626 m), weight 143 lb (64.9 kg).Body mass index is 24.55 kg/m. Muscle strengths and tone 5/5, postural reflexes and gait 0/0, and AIMS = 0 otherwise deferred for coronavirus shutdown  General Appearance: Casual, Fairly Groomed and Guarded  Eye Contact:  Good  Speech:  Clear and Coherent, Normal Rate and Talkative  Volume:  Normal  Mood:  Anxious, Dysphoric, Euthymic, Irritable and Worthless  Affect:  Congruent, Inappropriate, Restricted and Anxious  Thought Process:  Coherent, Goal Directed, Irrelevant, Linear and Descriptions of Associations: Tangential  Orientation:  Full (Time, Place, and Person)  Thought Content: Ilusions, Rumination and Tangential   Suicidal Thoughts:  No  Homicidal Thoughts:  No  Memory:  Immediate;   Good Remote;   Fair  Judgement:  Fair  Insight:  Fair   Psychomotor Activity:  Increased  Concentration:  Concentration: Fair and Attention Span: Fair  Recall:  Good  Fund of Knowledge: Good  Language: Fair  Assets:  Leisure Time Resilience Talents/Skills  ADL's:  Intact  Cognition: WNL  Prognosis:  Fair    DIAGNOSES:    ICD-10-CM   1. Chronic post-traumatic stress disorder  F43.12   2. Panic disorder  F41.0   3. Attention deficit hyperactivity disorder (ADHD), combined type, moderate  F90.2   4. Avoidant-restrictive food intake disorder (ARFID)  F50.82     Receiving Psychotherapy: No    RECOMMENDATIONS: Grief and loss, substance use triggers, and family object relations conflicts are addressed for ongoing stabilization and containment.  Symptom treatment matching with medication reduces Remeron while maintaining other medications including as needed for prevention and monitoring and safety hygiene.  She is E scribed Remeron reduced to 15 mg every bedtime sent as #90 with no refill to drugs for PTSD, panic, and ARFID.  She is E scribed Pristiq 50 mg every bedtime sent as #90 with no refill to [place] [facility] for PTSD, ADHD and panic.  She is E scribed Xanax 0.5 mg 3 times daily as needed for panic and anxiety sent as #90 with 1 refill [facility] for panic and PTSD.  He is E scribed hydroxyzine 50 mg at bedtime if needed for insomnia sent as #90 with no refill to [place] [facility] for panic and PTSD.  She returns for follow-up in 2 to 3 months or sooner if needed.   [place], MD

## 2019-09-19 IMAGING — DX DG CHEST 2V
2 series · 2 of 2 positions shown · non-contrast
Comparison: None.

CLINICAL DATA: Chest pain.

EXAM:
CHEST  2 VIEW

[chest pa]
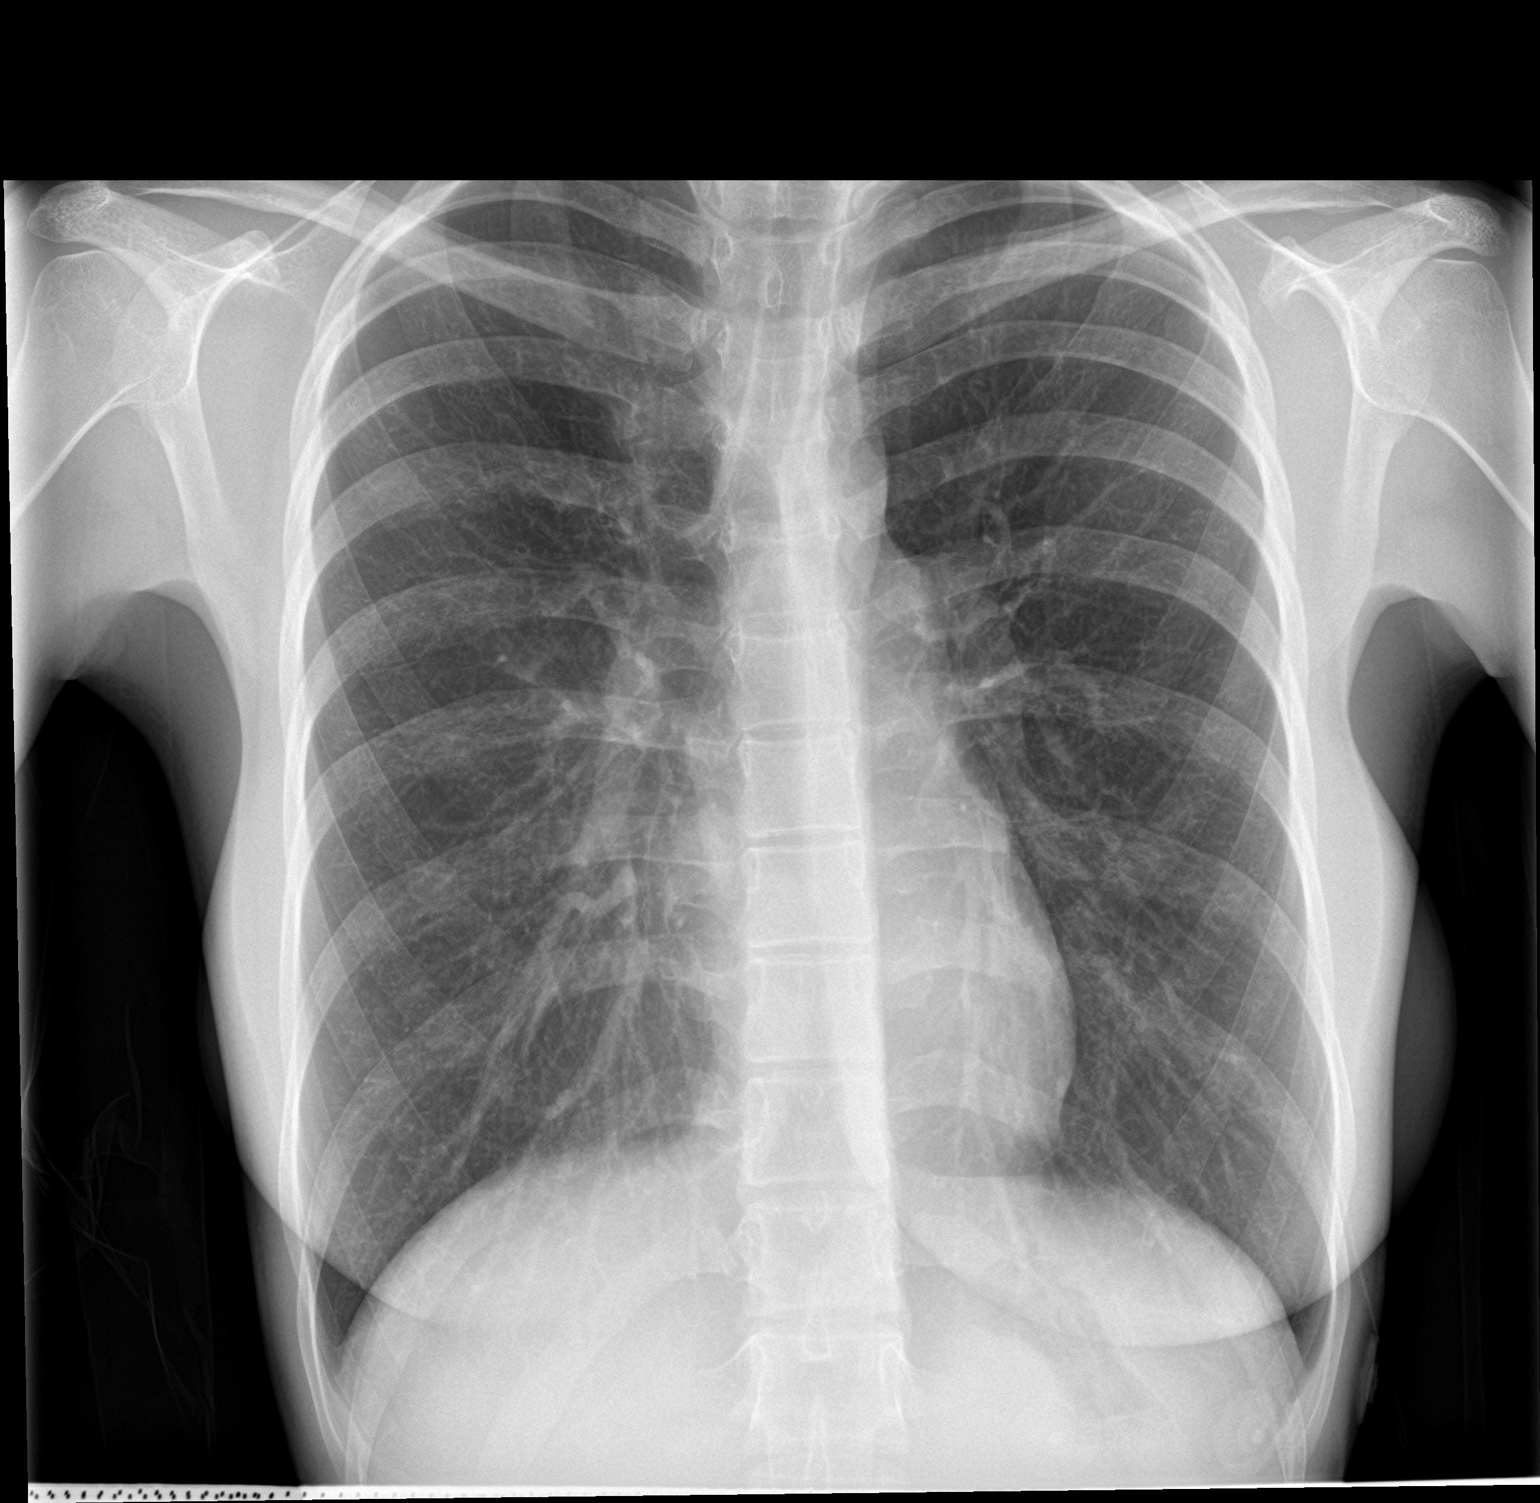

[chest lat]
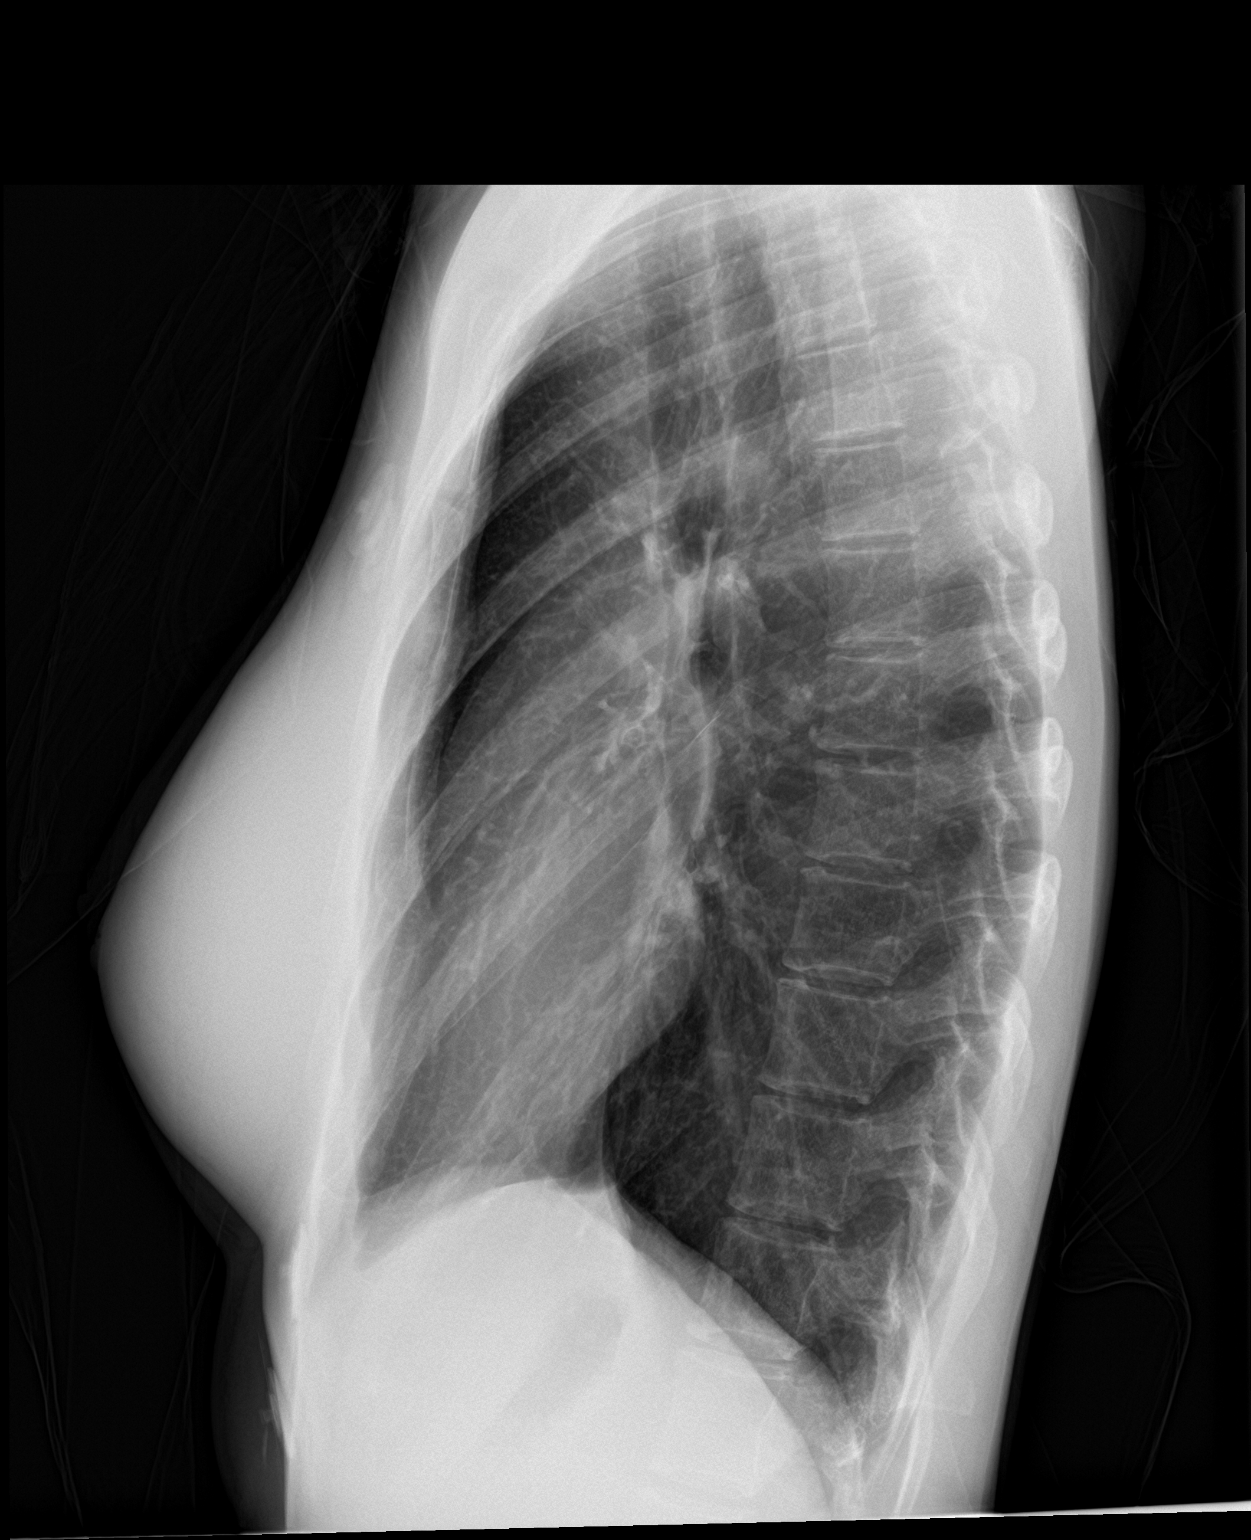

[2 of 2 positions shown; findings below may reference images not displayed]

FINDINGS: The heart size and mediastinal contours are within normal limits.
Both lungs are clear. Slight pectus excavatum deformity. The bones
otherwise appear normal.
IMPRESSION: No active cardiopulmonary disease.

## 2019-10-12 ENCOUNTER — Encounter: Payer: Self-pay | Admitting: Emergency Medicine

## 2019-10-12 ENCOUNTER — Ambulatory Visit (INDEPENDENT_AMBULATORY_CARE_PROVIDER_SITE_OTHER)
Admission: EM | Admit: 2019-10-12 | Discharge: 2019-10-12 | Disposition: A | Discharge status: Home / Self Care | Payer: 59 | Source: Home / Self Care

## 2019-10-12 ENCOUNTER — Other Ambulatory Visit: Payer: Self-pay

## 2019-10-12 ENCOUNTER — Emergency Department (HOSPITAL_COMMUNITY): Payer: 59

## 2019-10-12 ENCOUNTER — Encounter (HOSPITAL_COMMUNITY): Payer: Self-pay | Admitting: Emergency Medicine

## 2019-10-12 ENCOUNTER — Emergency Department (HOSPITAL_COMMUNITY)
Admission: EM | Admit: 2019-10-12 | Discharge: 2019-10-13 | Disposition: A | Discharge status: Home / Self Care | Payer: 59 | Attending: Emergency Medicine | Admitting: Emergency Medicine

## 2019-10-12 DIAGNOSIS — Z20822 Contact with and (suspected) exposure to covid-19: Secondary | ICD-10-CM | POA: Insufficient documentation

## 2019-10-12 DIAGNOSIS — R000 Tachycardia, unspecified: Secondary | ICD-10-CM | POA: Insufficient documentation

## 2019-10-12 DIAGNOSIS — Z87891 Personal history of nicotine dependence: Secondary | ICD-10-CM | POA: Diagnosis not present

## 2019-10-12 DIAGNOSIS — R05 Cough: Secondary | ICD-10-CM

## 2019-10-12 DIAGNOSIS — R0602 Shortness of breath: Secondary | ICD-10-CM

## 2019-10-12 DIAGNOSIS — R058 Other specified cough: Secondary | ICD-10-CM

## 2019-10-12 DIAGNOSIS — J4541 Moderate persistent asthma with (acute) exacerbation: Secondary | ICD-10-CM | POA: Diagnosis not present

## 2019-10-12 DIAGNOSIS — R9431 Abnormal electrocardiogram [ECG] [EKG]: Secondary | ICD-10-CM | POA: Insufficient documentation

## 2019-10-12 DIAGNOSIS — Z79899 Other long term (current) drug therapy: Secondary | ICD-10-CM | POA: Diagnosis not present

## 2019-10-12 LAB — CBC
HCT: 42.8 % (ref 36.0–46.0)
Hemoglobin: 14 g/dL (ref 12.0–15.0)
MCH: 31.7 pg (ref 26.0–34.0)
MCHC: 32.7 g/dL (ref 30.0–36.0)
MCV: 97.1 fL (ref 80.0–100.0)
Platelets: 341 10*3/uL (ref 150–400)
RBC: 4.41 MIL/uL (ref 3.87–5.11)
RDW: 12.5 % (ref 11.5–15.5)
WBC: 7.9 10*3/uL (ref 4.0–10.5)
nRBC: 0 % (ref 0.0–0.2)

## 2019-10-12 LAB — I-STAT BETA HCG BLOOD, ED (MC, WL, AP ONLY): I-stat hCG, quantitative: <5 m[IU]/mL (ref <5)

## 2019-10-12 LAB — BASIC METABOLIC PANEL
Anion gap: 12 (ref 5–15)
BUN: 6 mg/dL (ref 6–20)
CO2: 20 mmol/L — ABNORMAL LOW (ref 22–32)
Calcium: 9.4 mg/dL (ref 8.9–10.3)
Chloride: 106 mmol/L (ref 98–111)
Creatinine, Ser: 0.83 mg/dL (ref 0.44–1.00)
GFR calc Af Amer: >60 mL/min (ref >60)
GFR calc non Af Amer: >60 mL/min (ref >60)
Glucose, Bld: 91 mg/dL (ref 70–99)
Potassium: 4 mmol/L (ref 3.5–5.1)
Sodium: 138 mmol/L (ref 135–145)

## 2019-10-12 LAB — D-DIMER, QUANTITATIVE: D-Dimer, Quant: <0.27 ug/mL-FEU (ref 0.00–0.50)

## 2019-10-12 LAB — POC SARS CORONAVIRUS 2 AG -  ED: SARS Coronavirus 2 Ag: NEGATIVE

## 2019-10-12 MED ORDER — SODIUM CHLORIDE 0.9 % IV BOLUS
1000.0000 mL | Freq: Once | INTRAVENOUS | Status: AC
  Administered 2019-10-12: 23:00:00 1000 mL via INTRAVENOUS

## 2019-10-12 MED ORDER — PREDNISONE 10 MG PO TABS: 40.0000 mg | ORAL_TABLET | Freq: Every day | ORAL | 0 refills | Status: AC

## 2019-10-12 MED ORDER — BENZONATATE 100 MG PO CAPS: 100.0000 mg | ORAL_CAPSULE | Freq: Three times a day (TID) | ORAL | 0 refills | Status: DC | PRN

## 2019-10-12 MED ORDER — HYDROXYZINE HCL 25 MG PO TABS
25.0000 mg | ORAL_TABLET | Freq: Once | ORAL | Status: AC
  Administered 2019-10-12: 23:00:00 25 mg via ORAL
  Filled 2019-10-12: qty 1

## 2019-10-12 MED ORDER — MAGNESIUM SULFATE 2 GM/50ML IV SOLN
2.0000 g | Freq: Once | INTRAVENOUS | Status: AC
  Administered 2019-10-12: 2 g via INTRAVENOUS
  Filled 2019-10-12: qty 50

## 2019-10-12 MED ORDER — METHYLPREDNISOLONE SODIUM SUCC 125 MG IJ SOLR
125.0000 mg | Freq: Once | INTRAMUSCULAR | Status: AC
  Administered 2019-10-12: 23:00:00 125 mg via INTRAVENOUS
  Filled 2019-10-12: qty 2

## 2019-10-12 NOTE — ED Provider Notes (Signed)
[facility] EMERGENCY DEPARTMENT Provider Note   CSN: [id number] Arrival date & time: 10/12/19  1833     History Chief Complaint  Patient presents with  . Shortness of Breath    Ashley Chen is a 19 y.o. female with history of anxiety, PTSD, asthma, migraine headaches, seasonal allergies presents today for evaluation of gradual onset, progressively worsening shortness of breath for 4 days.  She was seen and evaluated at urgent care for her symptoms earlier today and was found to be persistently tachycardic and with an abnormal EKG so was recommended to come to the ED for further evaluation.  The time she was complaining of cough, nasal congestion and noted her boyfriend's mother tested positive for Covid.  She endorses dyspnea on exertion.  She denies fevers, chills, chest pain, abdominal pain, nausea, or vomiting.  No lightheadedness.  She reports that she has been using her albuterol inhalers more than usual, 3-4 times daily and nebulizers twice daily which is also unusual for her.  She reports that she had pneumonia and November 2020 and states "I do not think I ever fully recovered".  She denies recent travel, no hemoptysis, no prior history of DVT or PE.  She does not take any OCPs but has an IUD.  She notes feeling quite anxious here in the emergency department.  Does report that she had a labioplasty in her OB/GYN's office earlier this month and states she was sedated for this but notes that she was not "intubated".  She currently vapes and smokes marijuana.  She also notes that her PCP started her on Symbicort today.   The history is provided by the patient.       Past Medical History:  Diagnosis Date  . Anxiety   . Asthma   . Depression   . Migraine   . Seasonal allergies     Patient Active Problem List   Diagnosis Date Noted  . Panic disorder 09/06/2018  . 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 Attention deficit hyperactivity  disorder (ADHD), combined type, 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  . Moderate malnutrition (HCC) 10/12/2017  . Needle phobia 10/12/2017  . Chronic post-traumatic stress disorder 06/11/2015  . 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 Menorrhagia 08/14/2014    Past Surgical History:  Procedure Laterality Date  . FOOT SURGERY    . labial surgery    . TONSILECTOMY, ADENOIDECTOMY, BILATERAL MYRINGOTOMY AND TUBES     before age 49  . TONSILLECTOMY AND ADENOIDECTOMY    . umblical hernia repair     before age 52     OB History    Gravida  0   Para  0   Term  0   Preterm  0   AB  0   Living  0     SAB  0   TAB  0   Ectopic  0   Multiple  0   Live Births  0           Family History  Problem Relation Age of Onset  . Drug abuse Mother   . Anxiety disorder Mother   . ADD / ADHD Brother   . Mental retardation Brother   . Depression Paternal Uncle   . Alcohol abuse Paternal Uncle     Social History   Tobacco Use  . Smoking status: Former Smoker    Types: E-cigarettes  . Smokeless tobacco: Never Used  Substance Use Topics  . Alcohol use: Yes  Alcohol/week: 3.0 standard drinks    Types: 3 Shots of liquor per week    Comment: once a month  . Drug use: Yes    Types: Marijuana, Nitrous oxide    Comment: xanax last use in April 2018; marijuana use weekly as of 09/16/17    Home Medications Prior to Admission medications   Medication Sig Start Date End Date Taking? Authorizing Provider  albuterol (VENTOLIN HFA) 108 (90 Base) MCG/ACT inhaler INHALE 1-2 PUFFS INTO THE LUNGS EVERY 6 HOURS AS NEEDED FOR WHEEZING/SHORTNESS OF BREATH 06/19/19   Delight Hoh, MD  ALPRAZolam Duanne Moron) 0.5 MG tablet Take 1 tablet (0.5 mg total) by mouth 3 (three) times daily as needed for anxiety (panic). 09/10/19   Delight Hoh, MD  amoxicillin (AMOXIL) 500 MG capsule Take 1 capsule (500 mg total) by mouth 3 (three) times daily. 08/13/19   Hall-Potvin, [place], PA-C  benzonatate (TESSALON)  100 MG capsule Take 1 capsule (100 mg total) by mouth 3 (three) times daily as needed for cough. 10/12/19   Soliana Kitko A, PA-C  desvenlafaxine (PRISTIQ) 50 MG 24 hr tablet Take 1 tablet (50 mg total) by mouth at bedtime. 09/10/19   Delight Hoh, MD  EPINEPHrine (EPIPEN 2-PAK) 0.3 mg/0.3 mL IJ SOAJ injection Inject 0.3 mg into the muscle once as needed (severe allergic reaction).    [provider]  hydrOXYzine (VISTARIL) 50 MG capsule Take 1 capsule (50 mg total) by mouth at bedtime as needed for anxiety (insomnia). 09/10/19   Delight Hoh, MD  ibuprofen (ADVIL,MOTRIN) 200 MG tablet Take 600 mg by mouth every 6 (six) hours as needed for moderate pain or cramping.    [provider]  levonorgestrel (MIRENA) 20 MCG/24HR IUD 1 each by Intrauterine route once. Implanted March or April 2016    [provider]  mirtazapine (REMERON) 15 MG tablet Take 1 tablet (15 mg total) by mouth at bedtime. 09/10/19   Delight Hoh, MD  ondansetron (ZOFRAN-ODT) 4 MG disintegrating tablet Take 1 tablet (4 mg total) by mouth every 8 (eight) hours as needed for nausea or vomiting. 03/04/19   Triplett, Cari B, FNP  predniSONE (DELTASONE) 10 MG tablet Take 4 tablets (40 mg total) by mouth daily with breakfast for 4 days. 10/12/19 10/16/19  Rodell Perna A, PA-C    Allergies    Horse-derived products, Lactose intolerance (gi), and Other  Review of Systems   Review of Systems  Constitutional: Negative for chills and fever.  HENT: Positive for congestion.   Respiratory: Positive for cough and shortness of breath.   Cardiovascular: Negative for chest pain and leg swelling.  Gastrointestinal: Negative for abdominal pain, diarrhea, nausea and vomiting.  Neurological: Negative for syncope, weakness and light-headedness.  All other systems reviewed and are negative.   Physical Exam Updated Vital Signs BP 135/75 (BP Location: Left Arm)   Pulse (!) 117   Temp 98.3 F (36.8 C) (Oral)    Resp (!) 21   SpO2 98%   Physical Exam Vitals and nursing note reviewed.  Constitutional:      General: She is not in acute distress.    Appearance: She is well-developed.     Comments: Appears quite anxious  HENT:     Head: Normocephalic and atraumatic.  Eyes:     General:        Right eye: No discharge.        Left eye: No discharge.     Conjunctiva/sclera: Conjunctivae normal.  Neck:     Vascular: No JVD.     Trachea: No tracheal deviation.  Cardiovascular:     Rate and Rhythm: Regular rhythm. Tachycardia present.     Pulses: Normal pulses.     Comments: 2+ radial and DP/PT pulses bilaterally, Homans sign absent bilaterally, no lower extremity edema, no palpable cords, compartments are soft  Pulmonary:     Comments: Mildly tachypneic but speaking in full sentences without difficulty.  Diffuse expiratory wheezes.  The O2 saturations 98% on room air. Abdominal:     General: Bowel sounds are normal. There is no distension.     Palpations: Abdomen is soft. There is no mass.     Tenderness: There is no abdominal tenderness.  Musculoskeletal:     Cervical back: Normal range of motion and neck supple.     Right lower leg: No tenderness. No edema.     Left lower leg: No tenderness. No edema.  Skin:    General: Skin is warm and dry.     Findings: No erythema.  Neurological:     Mental Status: She is alert.  Psychiatric:        Behavior: Behavior normal.     ED Results / Procedures / Treatments   Labs (all labs ordered are listed, but only abnormal results are displayed) Labs Reviewed  BASIC METABOLIC PANEL - Abnormal; Notable for the following components:      Result Value   CO2 20 (*)    All other components within normal limits  CBC  D-DIMER, QUANTITATIVE (NOT AT [facility])  I-STAT BETA HCG BLOOD, ED ([facility], [facility], [facility] ONLY)    EKG ED ECG REPORT   Date: 10/12/2019  Rate: 122  Rhythm: sinus tachycardia  QRS Axis: normal  Intervals: normal  ST/T Wave abnormalities:  normal  Conduction Disutrbances:none  Narrative Interpretation:   Old EKG Reviewed: changes noted - faster today  I have personally reviewed the EKG tracing and agree with the computerized printout as noted.   Radiology DG Chest 2 View  Result Date: 10/12/2019 CLINICAL DATA:  Shortness of breath EXAM: CHEST - 2 VIEW COMPARISON:  09/16/2017 FINDINGS: Heart and mediastinal contours are within normal limits. No focal opacities or effusions. No acute bony abnormality. IMPRESSION: No active cardiopulmonary disease. Electronically Signed   By: Charlett Nose M.D.   On: 10/12/2019 19:51    Procedures Procedures (including critical care time)  Medications Ordered in ED Medications  magnesium sulfate IVPB 2 g 50 mL (2 g Intravenous New Bag/Given 10/12/19 2336)  sodium chloride 0.9 % bolus 1,000 mL (1,000 mLs Intravenous New Bag/Given 10/12/19 2249)  methylPREDNISolone sodium succinate (SOLU-MEDROL) 125 mg/2 mL injection 125 mg (125 mg Intravenous Given 10/12/19 2320)  hydrOXYzine (ATARAX/VISTARIL) tablet 25 mg (25 mg Oral Given 10/12/19 2320)    ED Course  I have reviewed the triage vital signs and the nursing notes.  Pertinent labs & imaging results that were available during my care of the patient were reviewed by me and considered in my medical decision making (see chart for details).    MDM Rules/Calculators/A&P                      Rayanne Padmanabhan was evaluated in Emergency Department on 10/13/2019 for the symptoms described in the history of present illness. She was evaluated in the context of the global COVID-19 pandemic, which necessitated consideration that the patient might be at risk for infection with the SARS-CoV-2 virus that causes COVID-19.  Institutional protocols and algorithms that pertain to the evaluation of patients at risk for COVID-19 are in a state of rapid change based on information released by regulatory bodies including the CDC and federal and state organizations. These  policies and algorithms were followed during the patient's care in the ED.  Patient presenting for evaluation of progressively worsening shortness of breath for 4 days.  No chest pain.  In the ED she is afebrile but persistently tachycardic.  She does appear quite anxious however.  She is mildly tachypneic but speaking in full sentences and O2 saturations are stable.  She exhibits expiratory wheezes on auscultation of the lungs.  EKG shows sinus tachycardia, no acute ischemic abnormalities.  Chest x-ray shows no acute cardiopulmonary abnormalities, no edema or consolidation.  Her lab work reviewed by me shows no leukocytosis, no anemia, no renal insufficiency, no electrolyte abnormalities.  Doubt ACS/MI in the absence of chest pain.  No evidence of pneumothorax on imaging today.  Doubt dissection, cardiac tamponade, or esophageal rupture.  No evidence of pericarditis. However, given she is persistently tachycardic she is not PERC negative so we will obtain D-dimer as she is low risk for PE per Wells criteria.  We will hold on any albuterol at this time and effort to not worsen her tachycardia.  We will give her IV fluids, magnesium, and Solu-Medrol in the ED. she had a point-of-care Covid test at urgent care which was normal so a send out test was obtained which is in process.  D-dimer is negative which is reassuring.  Her vitals have improved with IV fluids and magnesium.  On reevaluation she is resting comfortably in no apparent distress.  Reports she is feeling better.  He has eaten a [place] sandwich in the ED.  She was ambulated with stable SPO2 saturations.  Suspect viral illness causing secondary asthma exacerbation.  Will discharge with course of prednisone, discussed symptomatic management.  She has an outpatient Covid test which is in process.  Discussed quarantining at home.  Recommend follow-up with PCP early this week.  Discussed strict ED return precautions.  Patient verbalized understanding of and  agreement with plan and is safe for discharge home at this time.  Discussed with Dr. Hyacinth Meeker who agrees with assessment and plan at this time. Final Clinical Impression(s) / ED Diagnoses Final diagnoses:  SOB (shortness of breath)  Moderate persistent asthma with exacerbation    Rx / DC Orders ED Discharge Orders         Ordered    predniSONE (DELTASONE) 10 MG tablet  Daily with breakfast     10/12/19 2353    benzonatate (TESSALON) 100 MG capsule  3 times daily PRN     10/12/19 2353           Jeanie Sewer, PA-C 10/13/19 0006    Eber Hong, MD 10/13/19 [phone]

## 2019-10-12 NOTE — Discharge Instructions (Addendum)
19 year old female presenting with 4-day course of increased dyspnea, cough.  EKG done in office, compared to previous from December 2018: Sinus tachycardia with ventricular rate of 116 bpm.  QTc .  Concerning for right atrial enlargement with ST wave abnormality.  Patient appears to be anxious.  Rapid Covid negative, PCR pending.  Patient referred to ER for further evaluation/management given shortness of breath, tachycardia, EKG changes.

## 2019-10-12 NOTE — ED Triage Notes (Signed)
Pt presents to [facility] for assessment of 4 days of shortness of breath.  Pt c/o cough, mild nasal congestion.  C/o left upper abdominal pain with deep inspiration.  Denies sore throat, denies n/v.  Pt states home nebulizer treatment approx 1300, and puffs on inhaler 30 minutes PTA.  HR 124 at triage, O2 94%

## 2019-10-12 NOTE — ED Provider Notes (Addendum)
[facility]    CSN: [id number] Arrival date & time: 10/12/19  1710      History   Chief Complaint Chief Complaint  Patient presents with  . Cough    HPI Ashley Chen is a 19 y.o. female with history of allergies, asthma, anxiety presenting for 4-day course of shortness of breath.  Patient has had a cough, mild nasal congestion.  States that her boyfriend's mother tested positive for Covid, she is around them during the holidays.  Patient denies fever, myalgias, chest pain, lightheadedness, dizziness.  Took a nebulizer this morning without significant relief.   Past Medical History:  Diagnosis Date  . Anxiety   . Asthma   . Depression   . Migraine   . Seasonal allergies     Patient Active Problem List   Diagnosis Date Noted  . Panic disorder 09/06/2018  . 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 Attention deficit hyperactivity disorder (ADHD), combined type, 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  . Moderate malnutrition ([facility]) 10/12/2017  . Needle phobia 10/12/2017  . Chronic post-traumatic stress disorder 06/11/2015  . 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 Menorrhagia 08/14/2014    Past Surgical History:  Procedure Laterality Date  . FOOT SURGERY    . labial surgery    . TONSILECTOMY, ADENOIDECTOMY, BILATERAL MYRINGOTOMY AND TUBES     before age 69  . TONSILLECTOMY AND ADENOIDECTOMY    . umblical hernia repair     before age 43    OB History    Gravida  0   Para  0   Term  0   Preterm  0   AB  0   Living  0     SAB  0   TAB  0   Ectopic  0   Multiple  0   Live Births  0            Home Medications    Prior to Admission medications   Medication Sig Start Date End Date Taking? Authorizing Provider  albuterol (VENTOLIN HFA) 108 (90 Base) MCG/ACT inhaler INHALE 1-2 PUFFS INTO THE LUNGS EVERY 6 HOURS AS NEEDED FOR WHEEZING/SHORTNESS OF BREATH 06/19/19  Yes Delight Hoh, MD  ALPRAZolam Duanne Moron) 0.5 MG tablet Take 1  tablet (0.5 mg total) by mouth 3 (three) times daily as needed for anxiety (panic). 09/10/19   Delight Hoh, MD  amoxicillin (AMOXIL) 500 MG capsule Take 1 capsule (500 mg total) by mouth 3 (three) times daily. 08/13/19   Hall-Potvin, [place], PA-C  desvenlafaxine (PRISTIQ) 50 MG 24 hr tablet Take 1 tablet (50 mg total) by mouth at bedtime. 09/10/19   Delight Hoh, MD  EPINEPHrine (EPIPEN 2-PAK) 0.3 mg/0.3 mL IJ SOAJ injection Inject 0.3 mg into the muscle once as needed (severe allergic reaction).    [provider]  hydrOXYzine (VISTARIL) 50 MG capsule Take 1 capsule (50 mg total) by mouth at bedtime as needed for anxiety (insomnia). 09/10/19   Delight Hoh, MD  ibuprofen (ADVIL,MOTRIN) 200 MG tablet Take 600 mg by mouth every 6 (six) hours as needed for moderate pain or cramping.    [provider]  levonorgestrel (MIRENA) 20 MCG/24HR IUD 1 each by Intrauterine route once. Implanted March or April 2016    [provider]  mirtazapine (REMERON) 15 MG tablet Take 1 tablet (15 mg total) by mouth at bedtime. 09/10/19   Delight Hoh, MD  ondansetron (ZOFRAN-ODT) 4 MG disintegrating tablet Take 1 tablet (4 mg  total) by mouth every 8 (eight) hours as needed for nausea or vomiting. 03/04/19   Chinita Pester, FNP    Family History Family History  Problem Relation Age of Onset  . Drug abuse Mother   . Anxiety disorder Mother   . ADD / ADHD Brother   . Mental retardation Brother   . Depression Paternal Uncle   . Alcohol abuse Paternal Uncle     Social History Social History   Tobacco Use  . Smoking status: Former Smoker    Types: E-cigarettes  . Smokeless tobacco: Never Used  Substance Use Topics  . Alcohol use: Yes    Alcohol/week: 3.0 standard drinks    Types: 3 Shots of liquor per week    Comment: once a month  . Drug use: Yes    Types: Marijuana, Nitrous oxide    Comment: xanax last use in April 2018; marijuana use weekly as of 09/16/17       Allergies   Horse-derived products, Lactose intolerance (gi), and Other   Review of Systems As per HPI   Physical Exam Triage Vital Signs ED Triage Vitals  Enc Vitals Group     BP 10/12/19 1726 115/80     Pulse Rate 10/12/19 1726 (!) 124     Resp 10/12/19 1726 18     Temp 10/12/19 1726 97.6 F (36.4 C)     Temp Source 10/12/19 1726 Temporal     SpO2 10/12/19 1726 96 %     Weight --      Height --      Head Circumference --      Peak Flow --      Pain Score 10/12/19 1727 0     Pain Loc --      Pain Edu? --      Excl. in GC? --    No data found.  Updated Vital Signs BP 115/80 (BP Location: Left Arm)   Pulse (!) 124   Temp 97.6 F (36.4 C) (Temporal)   Resp 18   SpO2 96%   Visual Acuity Right Eye Distance:   Left Eye Distance:   Bilateral Distance:    Right Eye Near:   Left Eye Near:    Bilateral Near:     Physical Exam Constitutional:      Appearance: She is normal weight. She is not toxic-appearing or diaphoretic.     Comments: Patient appears tremulous, anxious  HENT:     Head: Normocephalic and atraumatic.  Eyes:     General: No scleral icterus.    Pupils: Pupils are equal, round, and reactive to light.  Neck:     Comments: Trachea midline, negative JVD Cardiovascular:     Rate and Rhythm: Regular rhythm. Tachycardia present.  Pulmonary:     Effort: Pulmonary effort is normal. No respiratory distress.     Breath sounds: No stridor. Wheezing and rhonchi present.     Comments: Diffuse through left lung fields Musculoskeletal:     Right lower leg: No edema.     Left lower leg: No edema.  Skin:    Capillary Refill: Capillary refill takes less than 2 seconds.     Coloration: Skin is pale. Skin is not jaundiced.     Findings: No rash.  Neurological:     Mental Status: She is alert and oriented to person, place, and time.      UC Treatments / Results  Labs (all labs ordered are listed, but only abnormal results are displayed)  Labs  Reviewed  POC SARS CORONAVIRUS 2 AG -  ED - Normal  NOVEL CORONAVIRUS, NAA    EKG   Radiology No results found.  Procedures Procedures (including critical care time)  Medications Ordered in UC Medications - No data to display  Initial Impression / Assessment and Plan / UC Course  I have reviewed the triage vital signs and the nursing notes.  Pertinent labs & imaging results that were available during my care of the patient were reviewed by me and considered in my medical decision making (see chart for details).     Patient afebrile, nontoxic though does appear anxious at times.  Heart rate ranging from 124-156 bpm in office.  EKG and rapid Covid were performed in office-PCR pending.  Please see discharge instructions for additional MDM.  Patient transported to ER in stable condition via EMS.  Due to acuity of patient's condition, diagnostic surrendering time in office, >30 minutes were spent with patient. Final Clinical Impressions(s) / UC Diagnoses   Final diagnoses:  SOB (shortness of breath)  Abnormal EKG  Tachycardia  Cough with exposure to COVID-19 virus     Discharge Instructions     19 year old female presenting with 4-day course of increased dyspnea, cough.  EKG done in office, compared to previous from December 2018: Sinus tachycardia with ventricular rate of 116 bpm.  QTc .  Concerning for right atrial enlargement with ST wave abnormality.  Patient appears to be anxious.  Rapid Covid negative, PCR pending.  Patient referred to ER for further evaluation/management given shortness of breath, tachycardia, EKG changes.    ED Prescriptions    None     PDMP not reviewed this encounter.   Hall-Potvin, [place], PA-C 10/12/19 1821    Hall-Potvin, [place], [place] 10/12/19 1822

## 2019-10-12 NOTE — ED Triage Notes (Signed)
Per [facility] pt sent from UC for shortness of breath x 4 days. Patient had EKG done in December of 2018 that appeared different than EKG completed at [facility]. Rapid covid negative at [facility] and send out swab done. patient speaking in full sentences but reports some anxiety.

## 2019-10-13 NOTE — Discharge Instructions (Signed)
Start taking prednisone beginning tomorrow.  You received the first dose in the emergency department today.  Continue taking your Symbicort twice daily.  Use the albuterol 1 to 2 puffs every 4-6 hours as needed for shortness of breath.  You can also use tessalon for cough. Also use other over the counter medications such as mucinex and zyrtec for your symptoms.   Covid test will result within 24 hours. Quarantine at home until then.   Follow up with primary care provider for re-evaluation of symptoms. Return to the emergency department if any concerning signs or symptoms develop.

## 2019-10-14 LAB — NOVEL CORONAVIRUS, NAA: SARS-CoV-2, NAA: NOT DETECTED

## 2019-10-30 ENCOUNTER — Other Ambulatory Visit: Payer: Self-pay | Admitting: Psychiatry

## 2019-10-30 DIAGNOSIS — F332 Major depressive disorder, recurrent severe without psychotic features: Secondary | ICD-10-CM

## 2019-10-31 ENCOUNTER — Other Ambulatory Visit: Payer: Self-pay

## 2019-10-31 DIAGNOSIS — F410 Panic disorder [episodic paroxysmal anxiety] without agoraphobia: Secondary | ICD-10-CM

## 2019-10-31 DIAGNOSIS — F4312 Post-traumatic stress disorder, chronic: Secondary | ICD-10-CM

## 2019-10-31 DIAGNOSIS — F5082 Avoidant/restrictive food intake disorder: Secondary | ICD-10-CM

## 2019-10-31 MED ORDER — MIRTAZAPINE 15 MG PO TABS: 15.0000 mg | ORAL_TABLET | Freq: Every day | ORAL | 0 refills | Status: DC

## 2019-11-12 ENCOUNTER — Ambulatory Visit (INDEPENDENT_AMBULATORY_CARE_PROVIDER_SITE_OTHER): Payer: 59 | Admitting: Psychiatry

## 2019-11-12 ENCOUNTER — Encounter: Payer: Self-pay | Admitting: Psychiatry

## 2019-11-12 ENCOUNTER — Other Ambulatory Visit: Payer: Self-pay

## 2019-11-12 VITALS — Ht 64.0 in | Wt 156.0 lb

## 2019-11-12 DIAGNOSIS — F902 Attention-deficit hyperactivity disorder, combined type: Secondary | ICD-10-CM

## 2019-11-12 DIAGNOSIS — F410 Panic disorder [episodic paroxysmal anxiety] without agoraphobia: Secondary | ICD-10-CM | POA: Diagnosis not present

## 2019-11-12 DIAGNOSIS — F4312 Post-traumatic stress disorder, chronic: Secondary | ICD-10-CM | POA: Diagnosis not present

## 2019-11-12 DIAGNOSIS — F5082 Avoidant/restrictive food intake disorder: Secondary | ICD-10-CM | POA: Diagnosis not present

## 2019-11-12 MED ORDER — ALPRAZOLAM 0.5 MG PO TABS: 0.5000 mg | ORAL_TABLET | Freq: Three times a day (TID) | ORAL | 2 refills | Status: DC | PRN

## 2019-11-12 MED ORDER — GUANFACINE HCL ER 2 MG PO TB24: 2.0000 mg | ORAL_TABLET | Freq: Every day | ORAL | 2 refills | Status: DC

## 2019-11-12 MED ORDER — DESVENLAFAXINE SUCCINATE ER 100 MG PO TB24: 100.0000 mg | ORAL_TABLET | Freq: Every day | ORAL | 0 refills | Status: DC
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
## 2019-11-12 NOTE — Patient Instructions (Signed)
Reduce Remeron 15 mg tablet by 1/2 tablet every night total 7.5 mg for 4 days then changed to 1/4 tablet total 3.75 mg every bedtime for 4 days then stop the Remeron and start the Intuniv (guanfacine ER) 2 mg every bedtime being careful about any dizziness with standing until adjusted to it.

## 2019-12-20 ENCOUNTER — Ambulatory Visit (INDEPENDENT_AMBULATORY_CARE_PROVIDER_SITE_OTHER): Payer: 59 | Admitting: Psychiatry

## 2019-12-20 ENCOUNTER — Encounter: Payer: Self-pay | Admitting: Psychiatry

## 2019-12-20 ENCOUNTER — Other Ambulatory Visit: Payer: Self-pay

## 2019-12-20 VITALS — Ht 64.0 in | Wt 143.0 lb

## 2019-12-20 DIAGNOSIS — F5082 Avoidant/restrictive food intake disorder: Secondary | ICD-10-CM | POA: Diagnosis not present

## 2019-12-20 DIAGNOSIS — F4312 Post-traumatic stress disorder, chronic: Secondary | ICD-10-CM

## 2019-12-20 DIAGNOSIS — F902 Attention-deficit hyperactivity disorder, combined type: Secondary | ICD-10-CM

## 2019-12-20 DIAGNOSIS — F410 Panic disorder [episodic paroxysmal anxiety] without agoraphobia: Secondary | ICD-10-CM

## 2019-12-20 MED ORDER — HYDROXYZINE PAMOATE 50 MG PO CAPS: 50.0000 mg | ORAL_CAPSULE | Freq: Every evening | ORAL | 0 refills | Status: DC | PRN
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
## 2019-12-23 ENCOUNTER — Encounter: Payer: Self-pay | Admitting: Psychiatry

## 2020-02-04 ENCOUNTER — Other Ambulatory Visit: Payer: Self-pay | Admitting: Psychiatry

## 2020-02-04 DIAGNOSIS — F4312 Post-traumatic stress disorder, chronic: Secondary | ICD-10-CM

## 2020-02-04 DIAGNOSIS — F410 Panic disorder [episodic paroxysmal anxiety] without agoraphobia: Secondary | ICD-10-CM

## 2020-02-04 DIAGNOSIS — F5082 Avoidant/restrictive food intake disorder: Secondary | ICD-10-CM

## 2020-02-04 DIAGNOSIS — F902 Attention-deficit hyperactivity disorder, combined type: Secondary | ICD-10-CM

## 2020-02-07 ENCOUNTER — Telehealth (INDEPENDENT_AMBULATORY_CARE_PROVIDER_SITE_OTHER): Payer: 59 | Admitting: Psychiatry

## 2020-02-07 ENCOUNTER — Encounter: Payer: Self-pay | Admitting: Psychiatry

## 2020-02-07 VITALS — Ht 64.0 in | Wt 130.0 lb

## 2020-02-07 DIAGNOSIS — F4312 Post-traumatic stress disorder, chronic: Secondary | ICD-10-CM | POA: Diagnosis not present

## 2020-02-07 DIAGNOSIS — F5082 Avoidant/restrictive food intake disorder: Secondary | ICD-10-CM | POA: Diagnosis not present

## 2020-02-07 DIAGNOSIS — F410 Panic disorder [episodic paroxysmal anxiety] without agoraphobia: Secondary | ICD-10-CM

## 2020-02-07 DIAGNOSIS — F902 Attention-deficit hyperactivity disorder, combined type: Secondary | ICD-10-CM

## 2020-02-07 MED ORDER — DESVENLAFAXINE SUCCINATE ER 100 MG PO TB24: 100.0000 mg | ORAL_TABLET | Freq: Every day | ORAL | 0 refills | Status: DC

## 2020-02-07 MED ORDER — HYDROXYZINE PAMOATE 50 MG PO CAPS: 50.0000 mg | ORAL_CAPSULE | Freq: Every evening | ORAL | 0 refills | Status: DC | PRN

## 2020-02-07 MED ORDER — ALPRAZOLAM 0.5 MG PO TABS: 0.5000 mg | ORAL_TABLET | Freq: Three times a day (TID) | ORAL | 2 refills | Status: DC | PRN
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
## 2020-05-07 ENCOUNTER — Other Ambulatory Visit: Payer: Self-pay

## 2020-05-07 ENCOUNTER — Ambulatory Visit (INDEPENDENT_AMBULATORY_CARE_PROVIDER_SITE_OTHER): Payer: 59 | Admitting: Psychiatry

## 2020-05-07 ENCOUNTER — Encounter: Payer: Self-pay | Admitting: Psychiatry

## 2020-05-07 VITALS — Ht 64.0 in | Wt 133.0 lb

## 2020-05-07 DIAGNOSIS — F902 Attention-deficit hyperactivity disorder, combined type: Secondary | ICD-10-CM

## 2020-05-07 DIAGNOSIS — F4312 Post-traumatic stress disorder, chronic: Secondary | ICD-10-CM

## 2020-05-07 DIAGNOSIS — F410 Panic disorder [episodic paroxysmal anxiety] without agoraphobia: Secondary | ICD-10-CM | POA: Diagnosis not present

## 2020-05-07 DIAGNOSIS — F5082 Avoidant/restrictive food intake disorder: Secondary | ICD-10-CM

## 2020-05-07 MED ORDER — ALPRAZOLAM 0.5 MG PO TABS: 0.5000 mg | ORAL_TABLET | Freq: Three times a day (TID) | ORAL | 2 refills | Status: DC | PRN

## 2020-05-07 MED ORDER — DESVENLAFAXINE SUCCINATE ER 100 MG PO TB24: 100.0000 mg | ORAL_TABLET | Freq: Every day | ORAL | 0 refills | Status: DC

## 2020-05-07 MED ORDER — HYDROXYZINE PAMOATE 50 MG PO CAPS: 50.0000 mg | ORAL_CAPSULE | Freq: Every evening | ORAL | 0 refills | Status: DC | PRN
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
## 2020-05-21 ENCOUNTER — Other Ambulatory Visit: Payer: Self-pay

## 2020-06-13 ENCOUNTER — Other Ambulatory Visit: Payer: Self-pay | Admitting: Psychiatry

## 2020-06-13 DIAGNOSIS — F902 Attention-deficit hyperactivity disorder, combined type: Secondary | ICD-10-CM

## 2020-06-13 DIAGNOSIS — F5082 Avoidant/restrictive food intake disorder: Secondary | ICD-10-CM

## 2020-06-13 DIAGNOSIS — F410 Panic disorder [episodic paroxysmal anxiety] without agoraphobia: Secondary | ICD-10-CM

## 2020-06-13 DIAGNOSIS — F4312 Post-traumatic stress disorder, chronic: Secondary | ICD-10-CM

## 2020-07-16 ENCOUNTER — Encounter: Payer: Self-pay | Admitting: Psychiatry

## 2020-07-18 ENCOUNTER — Other Ambulatory Visit: Payer: Self-pay

## 2020-07-18 ENCOUNTER — Other Ambulatory Visit: Payer: 59

## 2020-07-18 DIAGNOSIS — Z20822 Contact with and (suspected) exposure to covid-19: Secondary | ICD-10-CM

## 2020-07-19 LAB — SARS-COV-2, NAA 2 DAY TAT

## 2020-07-19 LAB — NOVEL CORONAVIRUS, NAA: SARS-CoV-2, NAA: NOT DETECTED

## 2020-08-04 ENCOUNTER — Ambulatory Visit (INDEPENDENT_AMBULATORY_CARE_PROVIDER_SITE_OTHER): Payer: 59 | Admitting: Psychiatry

## 2020-08-04 ENCOUNTER — Encounter: Payer: Self-pay | Admitting: Psychiatry

## 2020-08-04 ENCOUNTER — Other Ambulatory Visit: Payer: Self-pay

## 2020-08-04 VITALS — Ht 64.0 in | Wt 122.0 lb

## 2020-08-04 DIAGNOSIS — F410 Panic disorder [episodic paroxysmal anxiety] without agoraphobia: Secondary | ICD-10-CM

## 2020-08-04 DIAGNOSIS — F902 Attention-deficit hyperactivity disorder, combined type: Secondary | ICD-10-CM | POA: Diagnosis not present

## 2020-08-04 DIAGNOSIS — F5082 Avoidant/restrictive food intake disorder: Secondary | ICD-10-CM

## 2020-08-04 DIAGNOSIS — F4312 Post-traumatic stress disorder, chronic: Secondary | ICD-10-CM

## 2020-08-04 MED ORDER — ALPRAZOLAM 0.5 MG PO TABS: 0.5000 mg | ORAL_TABLET | Freq: Three times a day (TID) | ORAL | 5 refills | Status: DC | PRN

## 2020-08-04 MED ORDER — DESVENLAFAXINE SUCCINATE ER 100 MG PO TB24: 100.0000 mg | ORAL_TABLET | Freq: Every day | ORAL | 1 refills | Status: DC

## 2020-08-04 MED ORDER — HYDROXYZINE PAMOATE 50 MG PO CAPS: 50.0000 mg | ORAL_CAPSULE | Freq: Every evening | ORAL | 1 refills | Status: DC | PRN
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
## 2020-08-07 ENCOUNTER — Ambulatory Visit: Payer: 59 | Admitting: Psychiatry

## 2020-10-14 ENCOUNTER — Ambulatory Visit: Payer: 59 | Admitting: Psychiatry

## 2020-11-26 ENCOUNTER — Other Ambulatory Visit: Payer: Self-pay

## 2020-11-26 ENCOUNTER — Ambulatory Visit: Payer: 59 | Admitting: Psychiatry

## 2021-01-13 ENCOUNTER — Ambulatory Visit: Payer: 59 | Admitting: Psychiatry

## 2021-01-14 ENCOUNTER — Other Ambulatory Visit: Payer: Self-pay

## 2021-01-14 ENCOUNTER — Encounter: Payer: Self-pay | Admitting: Behavioral Health

## 2021-01-14 ENCOUNTER — Ambulatory Visit (INDEPENDENT_AMBULATORY_CARE_PROVIDER_SITE_OTHER): Payer: 59 | Admitting: Behavioral Health

## 2021-01-14 VITALS — BP 132/82 | HR 116 | Ht 63.0 in | Wt 124.0 lb

## 2021-01-14 DIAGNOSIS — F410 Panic disorder [episodic paroxysmal anxiety] without agoraphobia: Secondary | ICD-10-CM | POA: Diagnosis not present

## 2021-01-14 DIAGNOSIS — F411 Generalized anxiety disorder: Secondary | ICD-10-CM | POA: Diagnosis not present

## 2021-01-14 DIAGNOSIS — F4310 Post-traumatic stress disorder, unspecified: Secondary | ICD-10-CM

## 2021-01-14 DIAGNOSIS — G4700 Insomnia, unspecified: Secondary | ICD-10-CM

## 2021-01-14 MED ORDER — QUETIAPINE FUMARATE 25 MG PO TABS: 25.0000 mg | ORAL_TABLET | Freq: Every day | ORAL | 0 refills | Status: DC

## 2021-01-14 NOTE — Progress Notes (Signed)
Ashley Chen [id number] [birth year] 20 y.o.  Subjective:   Patient ID:  Ashley Chen is a 20 y.o. (DOB [birth year]) female.  Chief Complaint:  Chief Complaint  Patient presents with  . Anxiety  . Depression    HPI: 01/14/2021 Ashley Chen presents to the office today for follow-up of  Some breakthrough anxiety. She states that she is feeling pretty good overall. She says that she always gets a little more anxious with medical appointments. She says her anxiety today is 6 and depression is 0. Says she experiences anxiety exacerbations about 3 days per week. Says she is sleeping about 6 hours per night. She is expresses concern over medication changes and says that she has tried multiple failed psychiatric medications. She said that she had previously had [employer] testing conducted and it explained why she experienced no therapeutic result from [facility]. She stated that she would like to try Seroquel again for anxiety and sleep because she did not give it a fair shot previously.  Failed Known Psychiatric medications:  Prozac Zoloft Wellbutrin Mirtazapine (Worked except for excessive weight gain) Klonopin Buspar Prazosin Intuniv Concerta      Review of Systems:  Review of Systems  Cardiovascular: Negative for palpitations.  Neurological: Negative for tremors and weakness.    Medications:   Current Outpatient Medications  Medication Sig Dispense Refill  . albuterol (VENTOLIN HFA) 108 (90 Base) MCG/ACT inhaler INHALE 1-2 PUFFS INTO THE LUNGS EVERY 6 HOURS AS NEEDED FOR WHEEZING/SHORTNESS OF BREATH 6.7 g 0  . ALPRAZolam (XANAX) 0.5 MG tablet Take 1 tablet (0.5 mg total) by mouth 3 (three) times daily as needed for anxiety (panic). 90 tablet 5  . desvenlafaxine (PRISTIQ) 100 MG 24 hr tablet Take 1 tablet (100 mg total) by mouth at bedtime. 90 tablet 1  . EPINEPHrine 0.3 mg/0.3 mL IJ SOAJ injection Inject 0.3 mg into the muscle once as needed (severe allergic  reaction).    . hydrOXYzine (VISTARIL) 50 MG capsule Take 1 capsule (50 mg total) by mouth at bedtime as needed for anxiety (insomnia; Panic attack). 90 capsule 1  . levonorgestrel (MIRENA) 20 MCG/24HR IUD 1 each by Intrauterine route once. Implanted March or April 2016    . ondansetron (ZOFRAN-ODT) 4 MG disintegrating tablet Take 1 tablet (4 mg total) by mouth every 8 (eight) hours as needed for nausea or vomiting. 20 tablet 0  . QUEtiapine (SEROQUEL) 25 MG tablet Take 1 tablet (25 mg total) by mouth at bedtime. 30 tablet 0  . amoxicillin (AMOXIL) 500 MG capsule Take 1 capsule (500 mg total) by mouth 3 (three) times daily. 21 capsule 0  . benzonatate (TESSALON) 100 MG capsule Take 1 capsule (100 mg total) by mouth 3 (three) times daily as needed for cough. (Patient not taking: Reported on 01/14/2021) 21 capsule 0  . ibuprofen (ADVIL,MOTRIN) 200 MG tablet Take 600 mg by mouth every 6 (six) hours as needed for moderate pain or cramping.     No current facility-administered medications for this visit.    Medication Side Effects:   Allergies:  Allergies  Allergen Reactions  . Horse-Derived Products Anaphylaxis    Reaction to contact with horses  . Lactose Intolerance (Gi) Other (See Comments)    constipation  . Other Other (See Comments)    Allergy to cats and some dogs - water eyes - stuffy nose    Past Medical History:  Diagnosis Date  . Anxiety   . Asthma   . Depression   .  Migraine   . Seasonal allergies     Family History  Problem Relation Age of Onset  . Drug abuse Mother   . Anxiety disorder Mother   . ADD / ADHD Brother   . Mental retardation Brother   . Depression Paternal Uncle   . Alcohol abuse Paternal Uncle     Social History   Socioeconomic History  . Marital status: Single    Spouse name: Not on file  . Number of children: Not on file  . Years of education: 38  . Highest education level: High school graduate  Occupational History  . Not on file   Tobacco Use  . Smoking status: Former Smoker    Types: E-cigarettes  . Smokeless tobacco: Never Used  Vaping Use  . Vaping Use: Every day  . Substances: Nicotine, Flavoring  Substance and Sexual Activity  . Alcohol use: Yes    Alcohol/week: 3.0 standard drinks    Types: 3 Shots of liquor per week    Comment: once a month  . Drug use: Yes    Types: Marijuana, Nitrous oxide    Comment: xanax last use in April 2018; marijuana use weekly as of 09/16/17  . Sexual activity: Yes    Partners: Male    Birth control/protection: I.U.D.  Other Topics Concern  . Not on file  Social History Narrative  . Not on file   Social Determinants of Health   Financial Resource Strain: Not on file  Food Insecurity: Not on file  Transportation Needs: Not on file  Physical Activity: Not on file  Stress: Not on file  Social Connections: Not on file  Intimate Partner Violence: Not on file    Past Medical History, Surgical history, Social history, and Family history were reviewed and updated as appropriate.   Please see review of systems for further details on the patient's review from today.   Objective:   Physical Exam:  BP 132/82   Pulse (!) 116   Ht 5\' 3"  (1.6 m)   Wt 124 lb (56.2 kg)   BMI 21.97 kg/m   Physical Exam Psychiatric:        Attention and Perception: Attention and perception normal.        Mood and Affect: Mood and affect normal.        Speech: Speech normal.        Behavior: Behavior normal. Behavior is cooperative.        Cognition and Memory: Cognition and memory normal.        Judgment: Judgment normal.     Lab Review:     Component Value Date/Time   NA 138 10/12/2019 1856   K 4.0 10/12/2019 1856   CL 106 10/12/2019 1856   CO2 20 (L) 10/12/2019 1856   GLUCOSE 91 10/12/2019 1856   BUN 6 10/12/2019 1856   CREATININE 0.83 10/12/2019 1856   CREATININE 0.73 10/10/2017 1146   CALCIUM 9.4 10/12/2019 1856   PROT 8.4 (H) 03/04/2019 1635   ALBUMIN 5.3 (H)  03/04/2019 1635   AST 29 03/04/2019 1635   ALT 17 03/04/2019 1635   ALKPHOS 92 03/04/2019 1635   BILITOT 1.2 03/04/2019 1635   GFRNONAA >60 10/12/2019 1856   GFRAA >60 10/12/2019 1856       Component Value Date/Time   WBC 7.9 10/12/2019 1856   RBC 4.41 10/12/2019 1856   HGB 14.0 10/12/2019 1856   HCT 42.8 10/12/2019 1856   PLT 341 10/12/2019 1856   MCV 97.1  10/12/2019 1856   MCH 31.7 10/12/2019 1856   MCHC 32.7 10/12/2019 1856   RDW 12.5 10/12/2019 1856   LYMPHSABS 3,168 10/04/2017 1140   MONOABS 0.4 09/13/2015 1520   EOSABS 218 10/04/2017 1140   BASOSABS 47 10/04/2017 1140    No results found for: POCLITH, LITHIUM   No results found for: PHENYTOIN, PHENOBARB, VALPROATE, CBMZ   .res Assessment: Plan:    Jamesetta was seen today for anxiety and depression.  Diagnoses and all orders for this visit:  Generalized anxiety disorder -     QUEtiapine (SEROQUEL) 25 MG tablet; Take 1 tablet (25 mg total) by mouth at bedtime.  PTSD (post-traumatic stress disorder)  Panic disorder  Insomnia, unspecified type -     QUEtiapine (SEROQUEL) 25 MG tablet; Take 1 tablet (25 mg total) by mouth at bedtime.   Plan of Care: Patient to continue on current medication regimen. To add 25 mg Quetiapine at bedtime. To report any severe side effects promptly Provided emergency contact and after hour information To follow up in 4 weeks for reassessment.       Please see After Visit Summary for patient specific instructions.  Future Appointments  Date Time Provider Department Center  02/11/2021  1:00 PM Avelina Laine A, NP [facility] None    No orders of the defined types were placed in this encounter.   -------------------------------

## 2021-01-27 ENCOUNTER — Telehealth: Payer: Self-pay | Admitting: Behavioral Health

## 2021-01-27 NOTE — Telephone Encounter (Signed)
Pt stated she has been on seroqel for about 2 weeks now and she does not like how it makes her feel.She said she is snappy and on edge and that other people around her have noticed a change.She also said she is having vivid nightmares.I did inform her she may need to give the medication a little more time and that we can possibly prescribe something for the nightmares if she wants to continue the med.Please advise

## 2021-01-27 NOTE — Telephone Encounter (Signed)
Please advise her that she can cut tablet in half and try 12.5 mg. Some of the side effects may pass. Takes 4-6 weeks to work. She told me that she has already tried Prazosin in the past for nightmares and had side effects. Try that and if it doesn't get better in a couple weeks to call back. She also tried another med mirtazapine which worked for nightmares and sleep but had weight gain so she stopped taking it.

## 2021-01-27 NOTE — Telephone Encounter (Signed)
She called back and said she wants to just stop taking it and discuss mirtazapine at her appt.

## 2021-01-27 NOTE — Telephone Encounter (Signed)
Pt left a message stating that she has been on a new medication for two weeks now. She said that she is having second thoughts and would like to talk to brian or the nurse. Call her at [phone]

## 2021-01-27 NOTE — Telephone Encounter (Signed)
I have been trying to reach her and it goes straight to VM

## 2021-01-27 NOTE — Telephone Encounter (Signed)
Sent pt message via my chart and LVM letting her know to call back with any questions.

## 2021-01-27 NOTE — Telephone Encounter (Signed)
Noted! Thank you

## 2021-01-28 NOTE — Telephone Encounter (Signed)
That is fine. I will address with her then.

## 2021-02-11 ENCOUNTER — Ambulatory Visit (INDEPENDENT_AMBULATORY_CARE_PROVIDER_SITE_OTHER): Payer: 59 | Admitting: Behavioral Health

## 2021-02-11 ENCOUNTER — Other Ambulatory Visit: Payer: Self-pay

## 2021-02-11 ENCOUNTER — Encounter: Payer: Self-pay | Admitting: Behavioral Health

## 2021-02-11 VITALS — Wt 119.4 lb

## 2021-02-11 DIAGNOSIS — F4312 Post-traumatic stress disorder, chronic: Secondary | ICD-10-CM

## 2021-02-11 DIAGNOSIS — F411 Generalized anxiety disorder: Secondary | ICD-10-CM | POA: Diagnosis not present

## 2021-02-11 DIAGNOSIS — F410 Panic disorder [episodic paroxysmal anxiety] without agoraphobia: Secondary | ICD-10-CM

## 2021-02-11 DIAGNOSIS — G4700 Insomnia, unspecified: Secondary | ICD-10-CM

## 2021-02-11 MED ORDER — MIRTAZAPINE 15 MG PO TABS: 15.0000 mg | ORAL_TABLET | Freq: Every day | ORAL | 1 refills | Status: DC

## 2021-02-11 MED ORDER — HYDROXYZINE PAMOATE 50 MG PO CAPS: 50.0000 mg | ORAL_CAPSULE | Freq: Every evening | ORAL | 1 refills | Status: DC | PRN

## 2021-02-11 NOTE — Progress Notes (Signed)
Ashley Chen [id number] [birth year] 20 y.o.  Subjective:   Patient ID:  Ashley Chen is a 20 y.o. (DOB 04-29-2001) female.  Chief Complaint:  Chief Complaint  Patient presents with  . Anxiety  . Depression  . Follow-up  . Medication Problem  . Trauma  . Insomnia    Continued sleeping issues with occasional nightmares    HPI  01/14/2021 [facility] [employer] presents to the office today for follow-up of  Some breakthrough anxiety. She states that she is feeling pretty good overall. She says that she always gets a little more anxious with medical appointments. She says her anxiety today is 6 and depression is 0. Says she experiences anxiety exacerbations about 3 days per week. Says she is sleeping about 6 hours per night. She is expresses concern over medication changes and says that she has tried multiple failed psychiatric medications. She said that she had previously had [employer] testing conducted and it explained why she experienced no therapeutic result from [facility]. She stated that she would like to try Seroquel again for anxiety and sleep because she did not give it a fair shot previously.  Failed Known Psychiatric medications:  Prozac Zoloft Wellbutrin Mirtazapine (Worked except for excessive weight gain) Klonopin Buspar Prazosin Intuniv Concerta   HPI: 02/11/2021 20 year old female presents to this office today for follow up medication management. She reports that she was unable to tolerate the Seroquel and stopped taking it. She said that "it made me feel anxious, irritable and weird, and it decreased my appetite. I just didn't like the way it made me feel". She says that she wants to stay on all of her other medications and wants to restart Mirtazapine for sleep. She said it increased her weight last time she took it but it worked well. She reports her anxiety today is 5 and depression 2. No mania, no psychosis. No SI/HI.  Failed Known Psychiatric  medications:  Prozac Zoloft Wellbutrin Mirtazapine (Worked except for excessive weight gain) Klonopin Buspar Prazosin Intuniv Concerta Seroquel    Review of Systems:  Review of Systems  Constitutional: Negative.   Allergic/Immunologic: Negative.   Psychiatric/Behavioral: Positive for dysphoric mood. The patient is nervous/anxious.     Medications:   Current Outpatient Medications  Medication Sig Dispense Refill  . albuterol (VENTOLIN HFA) 108 (90 Base) MCG/ACT inhaler INHALE 1-2 PUFFS INTO THE LUNGS EVERY 6 HOURS AS NEEDED FOR WHEEZING/SHORTNESS OF BREATH 6.7 g 0  . ALPRAZolam (XANAX) 0.5 MG tablet Take 1 tablet (0.5 mg total) by mouth 3 (three) times daily as needed for anxiety (panic). 90 tablet 5  . desvenlafaxine (PRISTIQ) 100 MG 24 hr tablet Take 1 tablet (100 mg total) by mouth at bedtime. 90 tablet 1  . EPINEPHrine 0.3 mg/0.3 mL IJ SOAJ injection Inject 0.3 mg into the muscle once as needed (severe allergic reaction).    Marland Kitchen levonorgestrel (MIRENA) 20 MCG/24HR IUD 1 each by Intrauterine route once. Implanted March or April 2016    . mirtazapine (REMERON) 15 MG tablet Take 1 tablet (15 mg total) by mouth at bedtime. 30 tablet 1  . ondansetron (ZOFRAN-ODT) 4 MG disintegrating tablet Take 1 tablet (4 mg total) by mouth every 8 (eight) hours as needed for nausea or vomiting. 20 tablet 0  . benzonatate (TESSALON) 100 MG capsule Take 1 capsule (100 mg total) by mouth 3 (three) times daily as needed for cough. (Patient not taking: No sig reported) 21 capsule 0  . hydrOXYzine (VISTARIL) 50 MG  capsule Take 1 capsule (50 mg total) by mouth at bedtime as needed for anxiety (insomnia; Panic attack). 90 capsule 1   No current facility-administered medications for this visit.    Medication Side Effects: none  Allergies:  Allergies  Allergen Reactions  . Horse-Derived Products Anaphylaxis    Reaction to contact with horses  . Lactose Intolerance (Gi) Other (See Comments)     constipation  . Other Other (See Comments)    Allergy to cats and some dogs - water eyes - stuffy nose    Past Medical History:  Diagnosis Date  . Anxiety   . Asthma   . Depression   . Migraine   . Seasonal allergies      Past Medical History, Surgical history, Social history, and Family history were reviewed and updated as appropriate.   Please see review of systems for further details on the patient's review from today.   Objective:   Physical Exam:  Wt 119 lb 6.4 oz (54.2 kg)   BMI 21.15 kg/m   Physical Exam Psychiatric:        Attention and Perception: Attention and perception normal.        Mood and Affect: Mood and affect normal.        Speech: Speech normal.        Behavior: Behavior normal. Behavior is cooperative.        Cognition and Memory: Cognition and memory normal.        Judgment: Judgment normal.     Lab Review:     Component Value Date/Time   NA 138 10/12/2019 1856   K 4.0 10/12/2019 1856   CL 106 10/12/2019 1856   CO2 20 (L) 10/12/2019 1856   GLUCOSE 91 10/12/2019 1856   BUN 6 10/12/2019 1856   CREATININE 0.83 10/12/2019 1856   CREATININE 0.73 10/10/2017 1146   CALCIUM 9.4 10/12/2019 1856   PROT 8.4 (H) 03/04/2019 1635   ALBUMIN 5.3 (H) 03/04/2019 1635   AST 29 03/04/2019 1635   ALT 17 03/04/2019 1635   ALKPHOS 92 03/04/2019 1635   BILITOT 1.2 03/04/2019 1635   GFRNONAA >60 10/12/2019 1856   GFRAA >60 10/12/2019 1856       Component Value Date/Time   WBC 7.9 10/12/2019 1856   RBC 4.41 10/12/2019 1856   HGB 14.0 10/12/2019 1856   HCT 42.8 10/12/2019 1856   PLT 341 10/12/2019 1856   MCV 97.1 10/12/2019 1856   MCH 31.7 10/12/2019 1856   MCHC 32.7 10/12/2019 1856   RDW 12.5 10/12/2019 1856   LYMPHSABS 3,168 10/04/2017 1140   MONOABS 0.4 09/13/2015 1520   EOSABS 218 10/04/2017 1140   BASOSABS 47 10/04/2017 1140    No results found for: POCLITH, LITHIUM   No results found for: PHENYTOIN, PHENOBARB, VALPROATE, CBMZ    .res Assessment: Plan:    Ashley Chen was seen today for anxiety, depression, follow-up, medication problem, trauma and insomnia.  Diagnoses and all orders for this visit:  Generalized anxiety disorder -     mirtazapine (REMERON) 15 MG tablet; Take 1 tablet (15 mg total) by mouth at bedtime.  Chronic post-traumatic stress disorder -     hydrOXYzine (VISTARIL) 50 MG capsule; Take 1 capsule (50 mg total) by mouth at bedtime as needed for anxiety (insomnia; Panic attack). -     mirtazapine (REMERON) 15 MG tablet; Take 1 tablet (15 mg total) by mouth at bedtime.  Panic disorder -     hydrOXYzine (VISTARIL) 50 MG capsule;  Take 1 capsule (50 mg total) by mouth at bedtime as needed for anxiety (insomnia; Panic attack).  Insomnia, unspecified type -     mirtazapine (REMERON) 15 MG tablet; Take 1 tablet (15 mg total) by mouth at bedtime.    Plan of Care/Recommendations Patient to continue on current medication regimen. Patient Discontinued Seroquel To restart Mirtazapine 15 mg at bedtime To report any severe side effects promptly Provided emergency contact and after hour information To follow up in 6 weeks for reassessment. Greater than 50%  Of 30 min. face to face time with patient was spent on counseling and coordination of care. We discussed reasons why Seroquel was not successful and new medication options. Patient agreed to checking weights weekly.      Please see After Visit Summary for patient specific instructions.  Future Appointments  Date Time Provider Department Center  03/25/2021  1:00 PM Avelina Laine A, NP [facility] None    No orders of the defined types were placed in this encounter.   -------------------------------

## 2021-02-19 ENCOUNTER — Other Ambulatory Visit: Payer: Self-pay | Admitting: Psychiatry

## 2021-02-19 DIAGNOSIS — F410 Panic disorder [episodic paroxysmal anxiety] without agoraphobia: Secondary | ICD-10-CM

## 2021-02-19 DIAGNOSIS — F4312 Post-traumatic stress disorder, chronic: Secondary | ICD-10-CM

## 2021-02-19 DIAGNOSIS — F5082 Avoidant/restrictive food intake disorder: Secondary | ICD-10-CM

## 2021-03-23 ENCOUNTER — Other Ambulatory Visit: Payer: Self-pay | Admitting: Psychiatry

## 2021-03-23 DIAGNOSIS — F5082 Avoidant/restrictive food intake disorder: Secondary | ICD-10-CM

## 2021-03-23 DIAGNOSIS — F410 Panic disorder [episodic paroxysmal anxiety] without agoraphobia: Secondary | ICD-10-CM

## 2021-03-23 DIAGNOSIS — F4312 Post-traumatic stress disorder, chronic: Secondary | ICD-10-CM

## 2021-03-23 DIAGNOSIS — F902 Attention-deficit hyperactivity disorder, combined type: Secondary | ICD-10-CM

## 2021-03-23 NOTE — Telephone Encounter (Signed)
Last filled 5/27

## 2021-03-25 ENCOUNTER — Ambulatory Visit: Payer: 59 | Admitting: Behavioral Health

## 2021-03-25 ENCOUNTER — Encounter: Payer: Self-pay | Admitting: Behavioral Health

## 2021-03-25 ENCOUNTER — Other Ambulatory Visit: Payer: Self-pay

## 2021-03-25 DIAGNOSIS — F410 Panic disorder [episodic paroxysmal anxiety] without agoraphobia: Secondary | ICD-10-CM

## 2021-03-25 DIAGNOSIS — F4312 Post-traumatic stress disorder, chronic: Secondary | ICD-10-CM | POA: Diagnosis not present

## 2021-03-25 DIAGNOSIS — F411 Generalized anxiety disorder: Secondary | ICD-10-CM

## 2021-03-25 DIAGNOSIS — F902 Attention-deficit hyperactivity disorder, combined type: Secondary | ICD-10-CM | POA: Diagnosis not present

## 2021-03-25 DIAGNOSIS — F5082 Avoidant/restrictive food intake disorder: Secondary | ICD-10-CM

## 2021-03-25 DIAGNOSIS — G4700 Insomnia, unspecified: Secondary | ICD-10-CM

## 2021-03-25 MED ORDER — MIRTAZAPINE 15 MG PO TABS: 15.0000 mg | ORAL_TABLET | Freq: Every day | ORAL | 1 refills | Status: DC

## 2021-03-25 MED ORDER — ALPRAZOLAM 0.5 MG PO TABS: ORAL_TABLET | ORAL | 2 refills | Status: DC

## 2021-03-25 MED ORDER — HYDROXYZINE PAMOATE 50 MG PO CAPS: 50.0000 mg | ORAL_CAPSULE | Freq: Every evening | ORAL | 2 refills | Status: DC | PRN

## 2021-03-25 MED ORDER — DESVENLAFAXINE SUCCINATE ER 100 MG PO TB24: 100.0000 mg | ORAL_TABLET | Freq: Every day | ORAL | 2 refills | Status: DC

## 2021-03-25 NOTE — Progress Notes (Signed)
[facility]  Patient ID: Ashley Chen,  MRN: [id number]  PCP: Pcp, No  Date of Evaluation: 03/25/2021 Time spent:30 minutes  Chief Complaint:  Chief Complaint   Anxiety; Depression; Follow-up; Medication Refill; Trauma     HISTORY/CURRENT STATUS: HPI 20 year old female presents to this office for follow up and medication management. She is smiling and says she is doing "pretty good". Says that she is still dealing with legal and court drama for the restraining orders against her uncle and ex-boyfriend. Says the one against her uncle will be expiring and that her ex-boyfriends family has been contacting her to drop the restraining order and stalking charges. She says that she is just ready to get these situations behind her so she can move on. She reports her anxiety today at 3/10 and depression at 2/10. She says that she is sleeping on the mirtazapine 15 mg very well but still worries about weight gain. She believes her current medication regimen is working well and does not want to make any changes at this time. Reports no mania, no psychosis. Does not have SI or HI.   Prior Known Psychiatric medication trials: Prozac Zoloft Wellbutrin Mirtazapine (Worked except for excessive weight gain) Klonopin Buspar Prazosin Intuniv Concerta        Individual Medical History/ Review of Systems: Changes? :No   Allergies: Horse-derived products, Lactose intolerance (gi), and Other  Current Medications:  Current Outpatient Medications:    albuterol (VENTOLIN HFA) 108 (90 Base) MCG/ACT inhaler, INHALE 1-2 PUFFS INTO THE LUNGS EVERY 6 HOURS AS NEEDED FOR WHEEZING/SHORTNESS OF BREATH, Disp: 6.7 g, Rfl: 0   ALPRAZolam (XANAX) 0.5 MG tablet, TAKE 1 TABLET BY MOUTH 3 TIMES DAILY AS NEEDED FOR ANXIETY (PANIC)., Disp: 90 tablet, Rfl: 2   benzonatate (TESSALON) 100 MG capsule, Take 1 capsule (100 mg total) by mouth 3 (three) times daily as needed for cough. (Patient not  taking: No sig reported), Disp: 21 capsule, Rfl: 0   desvenlafaxine (PRISTIQ) 100 MG 24 hr tablet, Take 1 tablet (100 mg total) by mouth at bedtime., Disp: 30 tablet, Rfl: 2   EPINEPHrine 0.3 mg/0.3 mL IJ SOAJ injection, Inject 0.3 mg into the muscle once as needed (severe allergic reaction)., Disp: , Rfl:    hydrOXYzine (VISTARIL) 50 MG capsule, Take 1 capsule (50 mg total) by mouth at bedtime as needed for anxiety (insomnia; Panic attack)., Disp: 90 capsule, Rfl: 2   levonorgestrel (MIRENA) 20 MCG/24HR IUD, 1 each by Intrauterine route once. Implanted March or April 2016, Disp: , Rfl:    mirtazapine (REMERON) 15 MG tablet, Take 1 tablet (15 mg total) by mouth at bedtime., Disp: 30 tablet, Rfl: 1   ondansetron (ZOFRAN-ODT) 4 MG disintegrating tablet, Take 1 tablet (4 mg total) by mouth every 8 (eight) hours as needed for nausea or vomiting., Disp: 20 tablet, Rfl: 0 Medication Side Effects: none  Family Medical/ Social History: Changes? No  MENTAL HEALTH EXAM:  There were no vitals taken for this visit.There is no height or weight on file to calculate BMI.  General Appearance: Casual and Neat  Eye Contact:  Good  Speech:  Clear and Coherent  Volume:  Normal  Mood:  NA  Affect:  Appropriate  Thought Process:  Coherent  Orientation:  Full (Time, Place, and Person)  Thought Content: Logical   Suicidal Thoughts:  No  Homicidal Thoughts:  No  Memory:  WNL  Judgement:  Good  Insight:  Good  Psychomotor Activity:  Normal  Concentration:  Concentration: Good  Recall:  Good  Fund of Knowledge: Good  Language: Good  Assets:  Desire for Improvement  ADL's:  Intact  Cognition: WNL  Prognosis:  Good    DIAGNOSES:    ICD-10-CM   1. Chronic post-traumatic stress disorder  F43.12 desvenlafaxine (PRISTIQ) 100 MG 24 hr tablet    hydrOXYzine (VISTARIL) 50 MG capsule    mirtazapine (REMERON) 15 MG tablet    ALPRAZolam (XANAX) 0.5 MG tablet    2. Panic disorder  F41.0 desvenlafaxine  (PRISTIQ) 100 MG 24 hr tablet    hydrOXYzine (VISTARIL) 50 MG capsule    ALPRAZolam (XANAX) 0.5 MG tablet    3. Attention deficit hyperactivity disorder (ADHD), combined type, moderate  F90.2 desvenlafaxine (PRISTIQ) 100 MG 24 hr tablet    4. Avoidant-restrictive food intake disorder (ARFID)  F50.82 desvenlafaxine (PRISTIQ) 100 MG 24 hr tablet    ALPRAZolam (XANAX) 0.5 MG tablet    5. Generalized anxiety disorder  F41.1 mirtazapine (REMERON) 15 MG tablet    6. Insomnia, unspecified type  G47.00 mirtazapine (REMERON) 15 MG tablet      Receiving Psychotherapy: No    RECOMMENDATIONS:  Continue Xanax 0.5 mg 3 times daily prn for panic attacks Continue Mirtazapine 15 mg at bedtime Continue Pristiq 100 mg at bedtime Continue hydroxyzine 50 mg three times daily prn To report any severe side effects promptly Provided emergency contact and after hour information To follow up in 3 months for reassessment. Greater than 50%  Of 30 min. face to face time with patient was spent on counseling and coordination of care. Discussed her recent progress with medications. She is doing good with maintaining healthy weight. Discussed her current status with legal troubles and restraining orders against her uncle and ex-boyfriend. These issues have complicated improvement with anxiety and depression. No changes to medication regimen recommended at this time. Refills escribed to patients pharmacy.  Joan Flores, NP

## 2021-04-13 ENCOUNTER — Other Ambulatory Visit: Payer: Self-pay | Admitting: Behavioral Health

## 2021-04-13 DIAGNOSIS — F411 Generalized anxiety disorder: Secondary | ICD-10-CM

## 2021-04-13 DIAGNOSIS — F4312 Post-traumatic stress disorder, chronic: Secondary | ICD-10-CM

## 2021-04-13 DIAGNOSIS — G4700 Insomnia, unspecified: Secondary | ICD-10-CM

## 2021-04-15 ENCOUNTER — Other Ambulatory Visit: Payer: Self-pay | Admitting: Behavioral Health

## 2021-04-15 DIAGNOSIS — F411 Generalized anxiety disorder: Secondary | ICD-10-CM

## 2021-04-15 DIAGNOSIS — F4312 Post-traumatic stress disorder, chronic: Secondary | ICD-10-CM

## 2021-04-15 DIAGNOSIS — G4700 Insomnia, unspecified: Secondary | ICD-10-CM

## 2021-04-23 ENCOUNTER — Other Ambulatory Visit: Payer: Self-pay | Admitting: Behavioral Health

## 2021-04-23 DIAGNOSIS — F4312 Post-traumatic stress disorder, chronic: Secondary | ICD-10-CM

## 2021-04-23 DIAGNOSIS — F5082 Avoidant/restrictive food intake disorder: Secondary | ICD-10-CM

## 2021-04-23 DIAGNOSIS — F902 Attention-deficit hyperactivity disorder, combined type: Secondary | ICD-10-CM

## 2021-04-23 DIAGNOSIS — F410 Panic disorder [episodic paroxysmal anxiety] without agoraphobia: Secondary | ICD-10-CM

## 2021-04-27 ENCOUNTER — Other Ambulatory Visit: Payer: Self-pay | Admitting: Behavioral Health

## 2021-04-27 DIAGNOSIS — F4312 Post-traumatic stress disorder, chronic: Secondary | ICD-10-CM

## 2021-04-27 DIAGNOSIS — F410 Panic disorder [episodic paroxysmal anxiety] without agoraphobia: Secondary | ICD-10-CM

## 2021-04-27 DIAGNOSIS — F5082 Avoidant/restrictive food intake disorder: Secondary | ICD-10-CM

## 2021-04-27 NOTE — Telephone Encounter (Signed)
Next apt 9/28

## 2021-05-25 ENCOUNTER — Telehealth: Payer: Self-pay | Admitting: Behavioral Health

## 2021-05-25 NOTE — Telephone Encounter (Signed)
Pt called in to inform CR that she no longer uses the pharmacy [facility] on [address]  would like it removed. Ph: [phone]

## 2021-05-28 ENCOUNTER — Other Ambulatory Visit: Payer: Self-pay | Admitting: Behavioral Health

## 2021-05-28 DIAGNOSIS — F5082 Avoidant/restrictive food intake disorder: Secondary | ICD-10-CM

## 2021-05-28 DIAGNOSIS — F410 Panic disorder [episodic paroxysmal anxiety] without agoraphobia: Secondary | ICD-10-CM

## 2021-05-28 DIAGNOSIS — F4312 Post-traumatic stress disorder, chronic: Secondary | ICD-10-CM

## 2021-05-28 NOTE — Telephone Encounter (Signed)
Last filled 8/2

## 2021-06-24 ENCOUNTER — Other Ambulatory Visit: Payer: Self-pay

## 2021-06-24 ENCOUNTER — Ambulatory Visit (INDEPENDENT_AMBULATORY_CARE_PROVIDER_SITE_OTHER): Payer: 59 | Admitting: Behavioral Health

## 2021-06-24 ENCOUNTER — Encounter: Payer: Self-pay | Admitting: Behavioral Health

## 2021-06-24 VITALS — Wt 145.0 lb

## 2021-06-24 DIAGNOSIS — F4310 Post-traumatic stress disorder, unspecified: Secondary | ICD-10-CM

## 2021-06-24 DIAGNOSIS — G4700 Insomnia, unspecified: Secondary | ICD-10-CM | POA: Diagnosis not present

## 2021-06-24 DIAGNOSIS — F902 Attention-deficit hyperactivity disorder, combined type: Secondary | ICD-10-CM

## 2021-06-24 DIAGNOSIS — F3341 Major depressive disorder, recurrent, in partial remission: Secondary | ICD-10-CM

## 2021-06-24 DIAGNOSIS — F411 Generalized anxiety disorder: Secondary | ICD-10-CM

## 2021-06-24 DIAGNOSIS — F4312 Post-traumatic stress disorder, chronic: Secondary | ICD-10-CM | POA: Diagnosis not present

## 2021-06-24 DIAGNOSIS — F410 Panic disorder [episodic paroxysmal anxiety] without agoraphobia: Secondary | ICD-10-CM

## 2021-06-24 MED ORDER — ALPRAZOLAM 0.5 MG PO TABS: ORAL_TABLET | ORAL | 0 refills | Status: DC

## 2021-06-24 MED ORDER — HYDROXYZINE PAMOATE 50 MG PO CAPS: 50.0000 mg | ORAL_CAPSULE | Freq: Every evening | ORAL | 2 refills | Status: DC | PRN

## 2021-06-24 MED ORDER — MIRTAZAPINE 15 MG PO TABS: 15.0000 mg | ORAL_TABLET | Freq: Every day | ORAL | 0 refills | Status: DC

## 2021-06-24 MED ORDER — DESVENLAFAXINE SUCCINATE ER 100 MG PO TB24: 100.0000 mg | ORAL_TABLET | Freq: Every day | ORAL | 1 refills | Status: DC

## 2021-06-24 NOTE — Progress Notes (Signed)
[facility]  Patient ID: Ashley Chen,  MRN: [id number]  PCP: Pcp, No  Date of Evaluation: 06/24/2021 Time spent:20 minutes  Chief Complaint:  Chief Complaint   Anxiety; Depression; Follow-up; Medication Refill; Grief; Trauma     HISTORY/CURRENT STATUS: HPI  20 year old female presents to this office for follow up and medication management. She is smiling and says she is doing "pretty good". Her grandmother passed from lung cancer approximately one month ago. She is still grieving. She is tearful but says that she is trying to stay busy and is considering kickboxing class for distraction. She understands this would be a heathy coping mechanism. She reports her anxiety today at 4/10 and depression at 3/10. She says that she is sleeping on the mirtazapine 15 mg very well but still worries about weight gain. She believes her current medication regimen is working well and does not want to make any changes at this time. Reports no mania, no psychosis. Does not have SI or HI.    Prior Known Psychiatric medication trials: Prozac Zoloft Wellbutrin Mirtazapine (Worked except for excessive weight gain) Klonopin Buspar Prazosin Intuniv Concerta        Individual Medical History/ Review of Systems: Changes? :No   Allergies: Horse-derived products, Lactose intolerance (gi), and Other  Current Medications:  Current Outpatient Medications:    albuterol (VENTOLIN HFA) 108 (90 Base) MCG/ACT inhaler, INHALE 1-2 PUFFS INTO THE LUNGS EVERY 6 HOURS AS NEEDED FOR WHEEZING/SHORTNESS OF BREATH, Disp: 6.7 g, Rfl: 0   ALPRAZolam (XANAX) 0.5 MG tablet, Take one tablet by mouth 3 times daily as needed., Disp: 90 tablet, Rfl: 0   benzonatate (TESSALON) 100 MG capsule, Take 1 capsule (100 mg total) by mouth 3 (three) times daily as needed for cough. (Patient not taking: No sig reported), Disp: 21 capsule, Rfl: 0   desvenlafaxine (PRISTIQ) 100 MG 24 hr tablet, Take 1 tablet (100 mg  total) by mouth at bedtime., Disp: 90 tablet, Rfl: 1   EPINEPHrine 0.3 mg/0.3 mL IJ SOAJ injection, Inject 0.3 mg into the muscle once as needed (severe allergic reaction)., Disp: , Rfl:    hydrOXYzine (VISTARIL) 50 MG capsule, Take 1 capsule (50 mg total) by mouth at bedtime as needed for anxiety (insomnia; Panic attack)., Disp: 90 capsule, Rfl: 2   levonorgestrel (MIRENA) 20 MCG/24HR IUD, 1 each by Intrauterine route once. Implanted March or April 2016, Disp: , Rfl:    mirtazapine (REMERON) 15 MG tablet, Take 1 tablet (15 mg total) by mouth at bedtime., Disp: 90 tablet, Rfl: 0   ondansetron (ZOFRAN-ODT) 4 MG disintegrating tablet, Take 1 tablet (4 mg total) by mouth every 8 (eight) hours as needed for nausea or vomiting., Disp: 20 tablet, Rfl: 0 Medication Side Effects: none  Family Medical/ Social History: Changes? No  MENTAL HEALTH EXAM:  There were no vitals taken for this visit.There is no height or weight on file to calculate BMI.  General Appearance: Casual, Neat, and Well Groomed  Eye Contact:  Good  Speech:  Clear and Coherent  Volume:  Normal  Mood:  Depressed  Affect:  Depressed and Tearful  Thought Process:  Coherent  Orientation:  Full (Time, Place, and Person)  Thought Content: Logical   Suicidal Thoughts:  No  Homicidal Thoughts:  No  Memory:  WNL  Judgement:  Good  Insight:  Good  Psychomotor Activity:  Normal  Concentration:  Concentration: Good  Recall:  Good  Fund of Knowledge: Good  Language: Good  Assets:  Desire for Improvement  ADL's:  Intact  Cognition: WNL  Prognosis:  Good    DIAGNOSES:    ICD-10-CM   1. PTSD (post-traumatic stress disorder)  F43.10     2. Chronic post-traumatic stress disorder  F43.12 mirtazapine (REMERON) 15 MG tablet    desvenlafaxine (PRISTIQ) 100 MG 24 hr tablet    hydrOXYzine (VISTARIL) 50 MG capsule    ALPRAZolam (XANAX) 0.5 MG tablet    3. Generalized anxiety disorder  F41.1 mirtazapine (REMERON) 15 MG tablet    4.  Insomnia, unspecified type  G47.00 mirtazapine (REMERON) 15 MG tablet    5. Panic disorder  F41.0 desvenlafaxine (PRISTIQ) 100 MG 24 hr tablet    hydrOXYzine (VISTARIL) 50 MG capsule    ALPRAZolam (XANAX) 0.5 MG tablet    6. Attention deficit hyperactivity disorder (ADHD), combined type, moderate  F90.2 desvenlafaxine (PRISTIQ) 100 MG 24 hr tablet    7. Recurrent major depressive disorder, in partial remission (HCC)  F33.41       Receiving Psychotherapy: Yes    RECOMMENDATIONS:   Continue Xanax 0.5 mg 3 times daily prn for panic attacks Continue Mirtazapine 15 mg at bedtime Continue Pristiq 100 mg at bedtime Continue hydroxyzine 50 mg three times daily prn To report any severe side effects promptly Provided emergency contact and after hour information To follow up in 3 months for reassessment. Greater than 50%  Of 30 min. face to face time with patient was spent on counseling and coordination of care. Discussed her recent progress with medications. She is doing good with maintaining healthy weight. She is dealing with grief from the loss of her paternal grandmother but she understands grief is different for everyone and she is trying to exercise more to occupy her mind. No changes to medication regimen recommended at this time. Refills escribed to patients pharmacy. Refills provided today PDMP reviewed.      Joan Flores, NP

## 2021-07-30 ENCOUNTER — Other Ambulatory Visit: Payer: Self-pay | Admitting: Behavioral Health

## 2021-07-30 DIAGNOSIS — F4312 Post-traumatic stress disorder, chronic: Secondary | ICD-10-CM

## 2021-07-30 DIAGNOSIS — F410 Panic disorder [episodic paroxysmal anxiety] without agoraphobia: Secondary | ICD-10-CM

## 2021-07-31 NOTE — Telephone Encounter (Signed)
Last filled 10/4

## 2021-08-29 ENCOUNTER — Other Ambulatory Visit: Payer: Self-pay | Admitting: Behavioral Health

## 2021-08-29 DIAGNOSIS — F410 Panic disorder [episodic paroxysmal anxiety] without agoraphobia: Secondary | ICD-10-CM

## 2021-08-29 DIAGNOSIS — F4312 Post-traumatic stress disorder, chronic: Secondary | ICD-10-CM

## 2021-09-09 ENCOUNTER — Ambulatory Visit (INDEPENDENT_AMBULATORY_CARE_PROVIDER_SITE_OTHER): Payer: 59 | Admitting: Behavioral Health

## 2021-09-09 ENCOUNTER — Encounter: Payer: Self-pay | Admitting: Behavioral Health

## 2021-09-09 ENCOUNTER — Other Ambulatory Visit: Payer: Self-pay

## 2021-09-09 VITALS — Wt 150.0 lb

## 2021-09-09 DIAGNOSIS — F411 Generalized anxiety disorder: Secondary | ICD-10-CM

## 2021-09-09 DIAGNOSIS — G4700 Insomnia, unspecified: Secondary | ICD-10-CM

## 2021-09-09 DIAGNOSIS — F902 Attention-deficit hyperactivity disorder, combined type: Secondary | ICD-10-CM | POA: Diagnosis not present

## 2021-09-09 DIAGNOSIS — F4312 Post-traumatic stress disorder, chronic: Secondary | ICD-10-CM

## 2021-09-09 DIAGNOSIS — F410 Panic disorder [episodic paroxysmal anxiety] without agoraphobia: Secondary | ICD-10-CM

## 2021-09-09 DIAGNOSIS — F3341 Major depressive disorder, recurrent, in partial remission: Secondary | ICD-10-CM

## 2021-09-09 MED ORDER — MIRTAZAPINE 15 MG PO TABS: 15.0000 mg | ORAL_TABLET | Freq: Every day | ORAL | 1 refills | Status: DC

## 2021-09-09 NOTE — Progress Notes (Signed)
[facility]  Patient ID: Hanalei Glace,  MRN: [id number]  PCP: Pcp, No  Date of Evaluation: 09/09/2021 Time spent:30 minutes  Chief Complaint:  Chief Complaint   Anxiety; Depression; Follow-up; Medication Refill; Trauma     HISTORY/CURRENT STATUS: HPI 20 year old female presents to this office for follow up and medication management. She is smiling and says she is doing "pretty good". She gets a little "gloomy" thinking of her grandmother this time of year. She does report that she is coping better with "life stuff". Her relationship with her boyfriend is going great. Her father offered her a $1000.00 bucks to stop vaping and she is contemplating it.  She reports her anxiety today at 4/10 and depression at 2/10. She says that she is sleeping on the mirtazapine 15 mg very well but still worries about weight gain. She wants to reduce the dose temporarily to try to lose weight. She has been sick with bronchitis the last 3 weeks and on prednisone. She believes her current medication regimen is working well and does not want to make any changes at this time. Reports no mania, no psychosis. Does not have SI or HI.    Prior Known Psychiatric medication trials: Prozac Zoloft Wellbutrin Mirtazapine (Worked except for excessive weight gain) Klonopin Buspar Prazosin Intuniv Concerta       Individual Medical History/ Review of Systems: Changes? :No   Allergies: Horse-derived products, Lactose intolerance (gi), and Other  Current Medications:  Current Outpatient Medications:    albuterol (VENTOLIN HFA) 108 (90 Base) MCG/ACT inhaler, INHALE 1-2 PUFFS INTO THE LUNGS EVERY 6 HOURS AS NEEDED FOR WHEEZING/SHORTNESS OF BREATH, Disp: 6.7 g, Rfl: 0   ALPRAZolam (XANAX) 0.5 MG tablet, TAKE 1 TABLET BY MOUTH 3 TIMES DAILY AS NEEDED., Disp: 90 tablet, Rfl: 0   benzonatate (TESSALON) 100 MG capsule, Take 1 capsule (100 mg total) by mouth 3 (three) times daily as needed for  cough. (Patient not taking: No sig reported), Disp: 21 capsule, Rfl: 0   desvenlafaxine (PRISTIQ) 100 MG 24 hr tablet, Take 1 tablet (100 mg total) by mouth at bedtime., Disp: 90 tablet, Rfl: 1   EPINEPHrine 0.3 mg/0.3 mL IJ SOAJ injection, Inject 0.3 mg into the muscle once as needed (severe allergic reaction)., Disp: , Rfl:    hydrOXYzine (VISTARIL) 50 MG capsule, Take 1 capsule (50 mg total) by mouth at bedtime as needed for anxiety (insomnia; Panic attack)., Disp: 90 capsule, Rfl: 2   levonorgestrel (MIRENA) 20 MCG/24HR IUD, 1 each by Intrauterine route once. Implanted March or April 2016, Disp: , Rfl:    mirtazapine (REMERON) 15 MG tablet, Take 1 tablet (15 mg total) by mouth at bedtime., Disp: 90 tablet, Rfl: 1   ondansetron (ZOFRAN-ODT) 4 MG disintegrating tablet, Take 1 tablet (4 mg total) by mouth every 8 (eight) hours as needed for nausea or vomiting., Disp: 20 tablet, Rfl: 0 Medication Side Effects: none  Family Medical/ Social History: Changes? No  MENTAL HEALTH EXAM:  There were no vitals taken for this visit.There is no height or weight on file to calculate BMI.  General Appearance: Casual  Eye Contact:  Good  Speech:  Clear and Coherent  Volume:  Normal  Mood:  NA  Affect:  Appropriate  Thought Process:  Coherent  Orientation:  Full (Time, Place, and Person)  Thought Content: Logical   Suicidal Thoughts:  No  Homicidal Thoughts:  No  Memory:  WNL  Judgement:  Good  Insight:  Good  Psychomotor Activity:  Normal  Concentration:  Concentration: Good  Recall:  Good  Fund of Knowledge: Good  Language: Good  Assets:  Desire for Improvement  ADL's:  Intact  Cognition: WNL  Prognosis:  Good    DIAGNOSES:    ICD-10-CM   1. Chronic post-traumatic stress disorder  F43.12 mirtazapine (REMERON) 15 MG tablet    2. Generalized anxiety disorder  F41.1 mirtazapine (REMERON) 15 MG tablet    3. Panic disorder  F41.0     4. Attention deficit hyperactivity disorder (ADHD),  combined type, moderate  F90.2     5. Recurrent major depressive disorder, in partial remission ([place])  F33.41     6. Insomnia, unspecified type  G47.00 mirtazapine (REMERON) 15 MG tablet      Receiving Psychotherapy: Yes Carney Bern   RECOMMENDATIONS:   Continue Xanax 0.5 mg 3 times daily prn for panic attacks Continue Mirtazapine 15 mg at bedtime Continue Pristiq 100 mg at bedtime Continue hydroxyzine 50 mg three times daily prn To report any severe side effects promptly Provided emergency contact and after hour information To follow up in 3 months for reassessment. Greater than 50%  Of 20 min. face to face time with patient was spent on counseling and coordination of care. Discussed her recent progress with medications. She is doing good with maintaining healthy weight although she picked up a few pounds due to being on prednisone for bronchitis. She is considering trying to quit vaping. Her father offered her a $1000.00 to quit. Educated her on latest research on vaping and its possible effects. No changes to medication regimen recommended at this time. Refills escribed to patients pharmacy. Refills provided today PDMP reviewed.      Elwanda Brooklyn, NP

## 2021-09-11 ENCOUNTER — Ambulatory Visit (INDEPENDENT_AMBULATORY_CARE_PROVIDER_SITE_OTHER): Payer: 59 | Admitting: Internal Medicine

## 2021-09-11 ENCOUNTER — Other Ambulatory Visit: Payer: Self-pay

## 2021-09-11 ENCOUNTER — Encounter: Payer: Self-pay | Admitting: Internal Medicine

## 2021-09-11 VITALS — BP 118/74 | HR 93 | Ht 63.0 in | Wt 157.2 lb

## 2021-09-11 DIAGNOSIS — J4540 Moderate persistent asthma, uncomplicated: Secondary | ICD-10-CM | POA: Diagnosis not present

## 2021-09-11 MED ORDER — BUDESONIDE-FORMOTEROL FUMARATE 160-4.5 MCG/ACT IN AERO: 2.0000 | INHALATION_SPRAY | Freq: Two times a day (BID) | RESPIRATORY_TRACT | 6 refills | Status: DC

## 2021-09-11 MED ORDER — BUPROPION HCL ER (SR) 150 MG PO TB12: 150.0000 mg | ORAL_TABLET | Freq: Two times a day (BID) | ORAL | 3 refills | Status: DC

## 2021-09-11 NOTE — Patient Instructions (Addendum)
Please schedule follow up scheduled with myself in 2 months.  If my schedule is not open yet, we will contact you with a reminder closer to that time. Please call [phone] if you haven't heard from [place] a month before.   Before your next visit I would like you to have: Spirometry/Feno - at next visit  Start taking symbicort again 2 puffs in the morning 2 puffs at night. Use chamber. Gargle after use.   Start gradually coming down on the amount of nicotine in your cartridges to wean yourself off nicotine.   Bupropion -- Bupropion (brand names: Zyban, Wellbutrin) is an antidepressant that can be used to help you stop smoking.  Start taking it once per day for three days, then increase to twice daily starting four weeks before the quit date.  You should typically continue for 7 to 12 weeks after you quit smoking.  Please do not stop taking this medication abruptly.  When you are ready to stop we will have you go to once daily for two weeks and then you can do once every other day for a week before you stop.   Bupropion is generally well-tolerated, but it may cause dry mouth and difficulty sleeping. The drug should not be used by people who have a seizure disorder or bipolar (manic-depressive) disorder, and it is not recommended for those who have head trauma, anorexia nervosa, or bulimia, or for those who drink alcohol excessively.

## 2021-09-11 NOTE — Progress Notes (Signed)
Ashley Chen    [id number]    12/10/2000  Primary Care Physician:Ramachandran, Campbell Lerner, MD  Referring Physician: Linus Galas, NP [address] [place],  [place] [address] Reason for Consultation: shortness of breath Date of Consultation: 09/11/2021  Chief complaint:   Chief Complaint  Patient presents with   Consult    Referred by PCP for chronic cough and SOB over the past 1.5 years. States the SOB tends to go away with rest and inhaler. Has had covid twice. Noticed that her breathing changed drastically after 2nd round of covid last Christmas.      HPI: Ashley Chen is a 20 y.o. woman who presents for new patient evaluation of dyspnea. She has had Covid twice, most recently about a year ago. Since then worsening dyspnea. All of her symptoms of covid have improved except she still hasn't gotten her smell back.  She has had two courses of prednisone and two courses of antibiotics in the last month. She feels the taper of prednisone didn't help but 50 mg x 7 days did help her. She has a nebulizer machine and with albuterol and symbicort.  She feels the albuterol helps more than symbicort.   She had recurrent pneumonia and bronchitis in middle school and high school. She had exercised induced asthma in  She started vaping at age 20 and symptom onset was at this time.  Mother has asthma and smoked.   She has allergies and takes zyrtec and flonase daily.   She has an IUD - not pregnant.   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  Smoking history: passive smoke exposure - vapes daily. One cartridge lasts here 4-5 days. She vapes marijuana as well.   Social History   Occupational History   Not on file  Tobacco Use   Smoking status: Former    Types: E-cigarettes   Smokeless tobacco: Never  Vaping Use   Vaping Use: Every day   Substances:  Nicotine, Flavoring  Substance and Sexual Activity   Alcohol use: Yes    Alcohol/week: 3.0 standard drinks    Types: 3 Shots of liquor per week    Comment: once a month   Drug use: Yes    Types: Marijuana, Nitrous oxide    Comment: xanax last use in April 2018; marijuana use weekly as of 09/16/17   Sexual activity: Yes    Partners: Male    Birth control/protection: I.U.D.    Relevant family history:  Family History  Problem Relation Age of Onset   Drug abuse Mother    Anxiety disorder Mother    ADD / ADHD Brother    Mental retardation Brother    Depression Paternal Uncle    Alcohol abuse Paternal Uncle     Past Medical History:  Diagnosis Date   Anxiety    Asthma    Depression    Migraine    Seasonal allergies     Past Surgical History:  Procedure Laterality Date   FOOT SURGERY     labial surgery     TONSILECTOMY, ADENOIDECTOMY, BILATERAL MYRINGOTOMY AND TUBES     before age 20   TONSILLECTOMY AND ADENOIDECTOMY     umblical hernia repair     before age 12    Physical Exam: Blood pressure 118/74, pulse 93, height 5\' 3"  (1.6  m), weight 157 lb 3.2 oz (71.3 kg), SpO2 97 %. Gen:      No acute distress ENT:  no nasal polyps, mucus membranes moist Lungs:    No increased respiratory effort, symmetric chest wall excursion, clear to auscultation bilaterally, no wheezes or crackles CV:         tachycardic rate and regular rhythm; no murmurs, rubs, or gallops.  No pedal edema Abd:      + bowel sounds; soft, non-tender; no distension MSK: no acute synovitis of DIP or PIP joints, no mechanics hands.  Skin:      Warm and dry; no rashes Neuro: normal speech, no focal facial asymmetry Psych: alert and oriented x3, normal mood and affect   Data Reviewed/Medical Decision Making:  Independent interpretation of tests: Imaging:  Review of patient's chest xray Jan 2021 images revealed no acute cardiopulmonary - mild hyperinflation process. The patient's images have been  independently reviewed by me.    PFTs:  No flowsheet data found.  Labs:  Lab Results  Component Value Date   WBC 7.9 10/12/2019   HGB 14.0 10/12/2019   HCT 42.8 10/12/2019   MCV 97.1 10/12/2019   PLT 341 10/12/2019   Lab Results  Component Value Date   NA 138 10/12/2019   K 4.0 10/12/2019   CL 106 10/12/2019   CO2 20 (L) 10/12/2019     Immunization status:   There is no immunization history on file for this patient.   I reviewed prior external note(s) from primary care, behavioral health  I reviewed the result(s) of the labs and imaging as noted above.   I have ordered spirometry at next appointment  Assessment:  Moderate persistent asthma, not well controlled Nicotine dependence  Plan/Recommendations: Resume symbicort 2 puffs twice a day with spacer.  continue albuterol prn.  Wellbutrin for smoking cessation.  Titrate down the amount of nicotine in cartridges for vaping.   Smoking Cessation Counseling:  1. The patient is an everyday smoker and symptomatic due to the following condition asthma 2. The patient is currently contemplative in quitting smoking. 3. I advised patient to quit smoking. 4. We identified patient specific barriers to change.  5. I personally spent 3 minutes counseling the patient regarding tobacco use disorder. 6. We discussed management of stress and anxiety to help with smoking cessation, when applicable. 7. We discussed nicotine replacement therapy, Wellbutrin, Chantix as possible options. 8. I advised setting a quit date. 9. Follow?up arranged with our office to continue ongoing discussions. 10.Resources given to patient including quit hotline.    We discussed disease management and progression at length today regarding asthma and vaping.    Return to Care: Return in about 2 months (around 11/12/2021).  Durel Salts, MD Pulmonary and Critical Care Medicine [facility] Office:[phone]  CC: Linus Galas, NP

## 2021-09-30 ENCOUNTER — Other Ambulatory Visit: Payer: Self-pay | Admitting: Behavioral Health

## 2021-09-30 DIAGNOSIS — F4312 Post-traumatic stress disorder, chronic: Secondary | ICD-10-CM

## 2021-09-30 DIAGNOSIS — F410 Panic disorder [episodic paroxysmal anxiety] without agoraphobia: Secondary | ICD-10-CM

## 2021-10-01 NOTE — Telephone Encounter (Signed)
Last filled 12/5 appt on 12/08/21

## 2021-10-14 IMAGING — CR DG CHEST 2V
2 series · 2 of 2 positions shown · non-contrast
Comparison: 09/16/2017

CLINICAL DATA: Shortness of breath

EXAM:
CHEST - 2 VIEW

[chest pa]
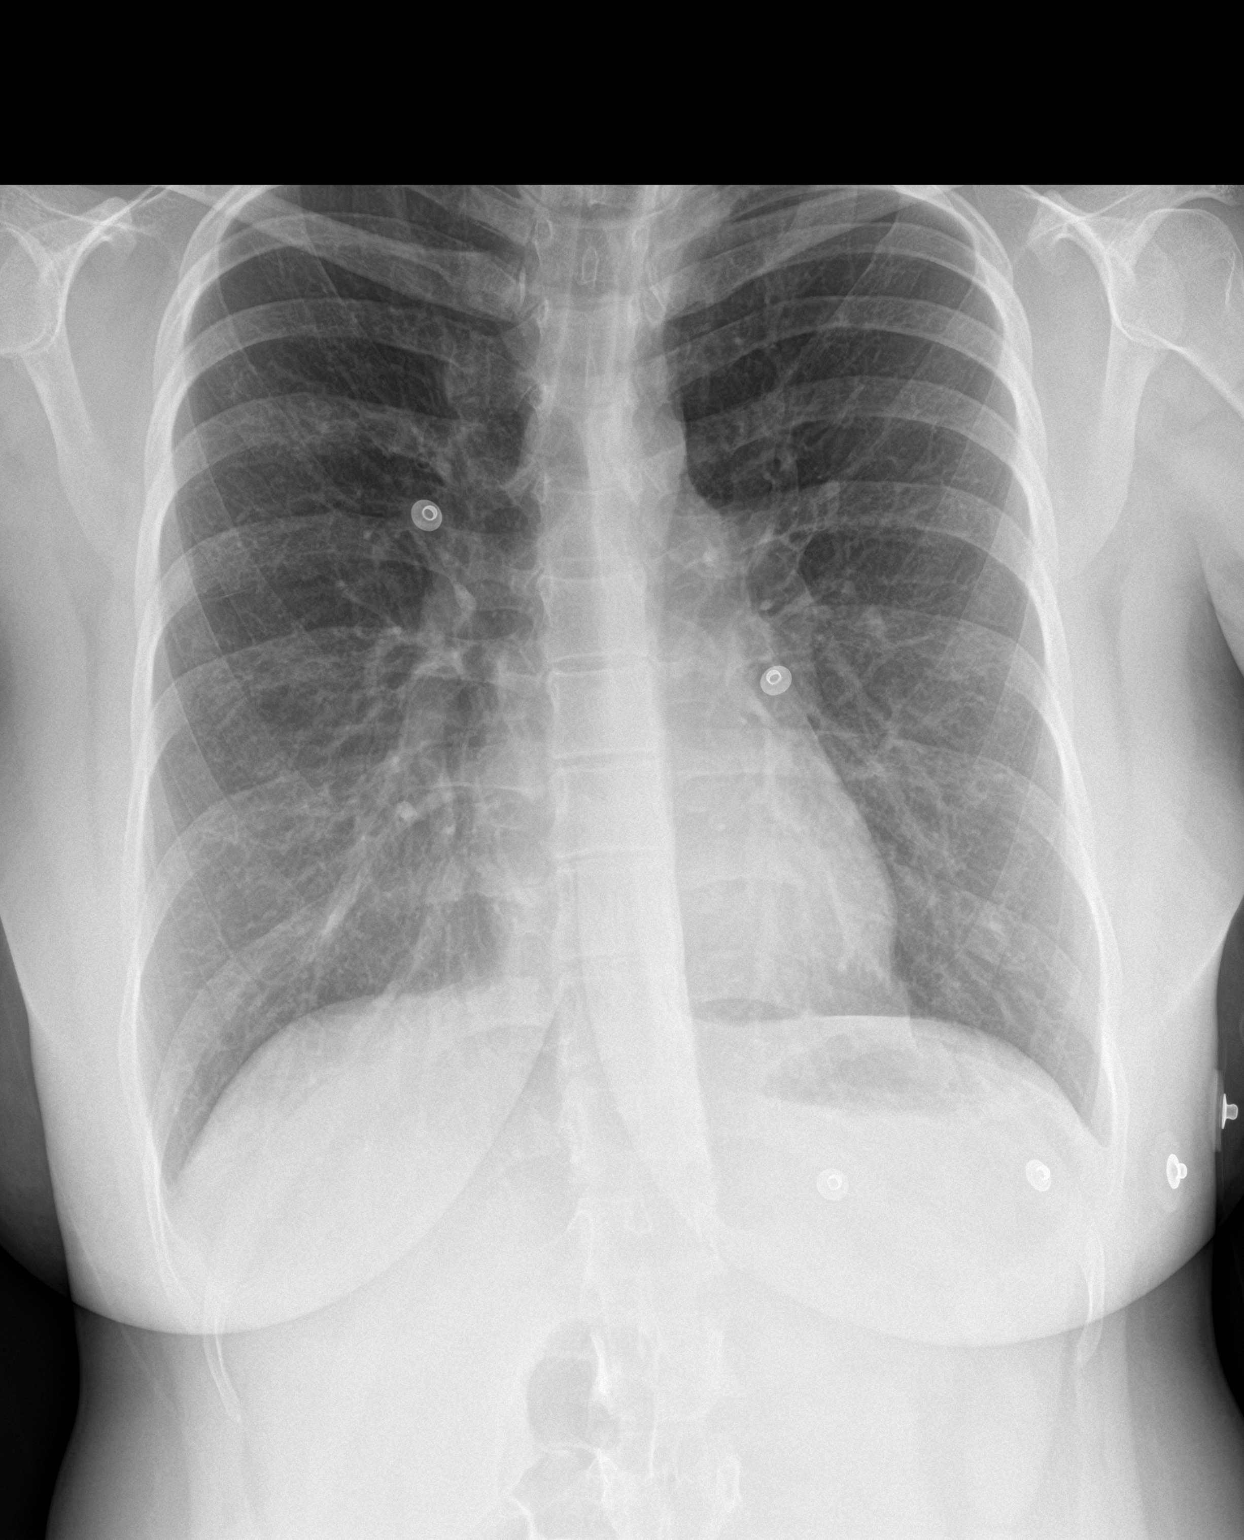

[chest lat]
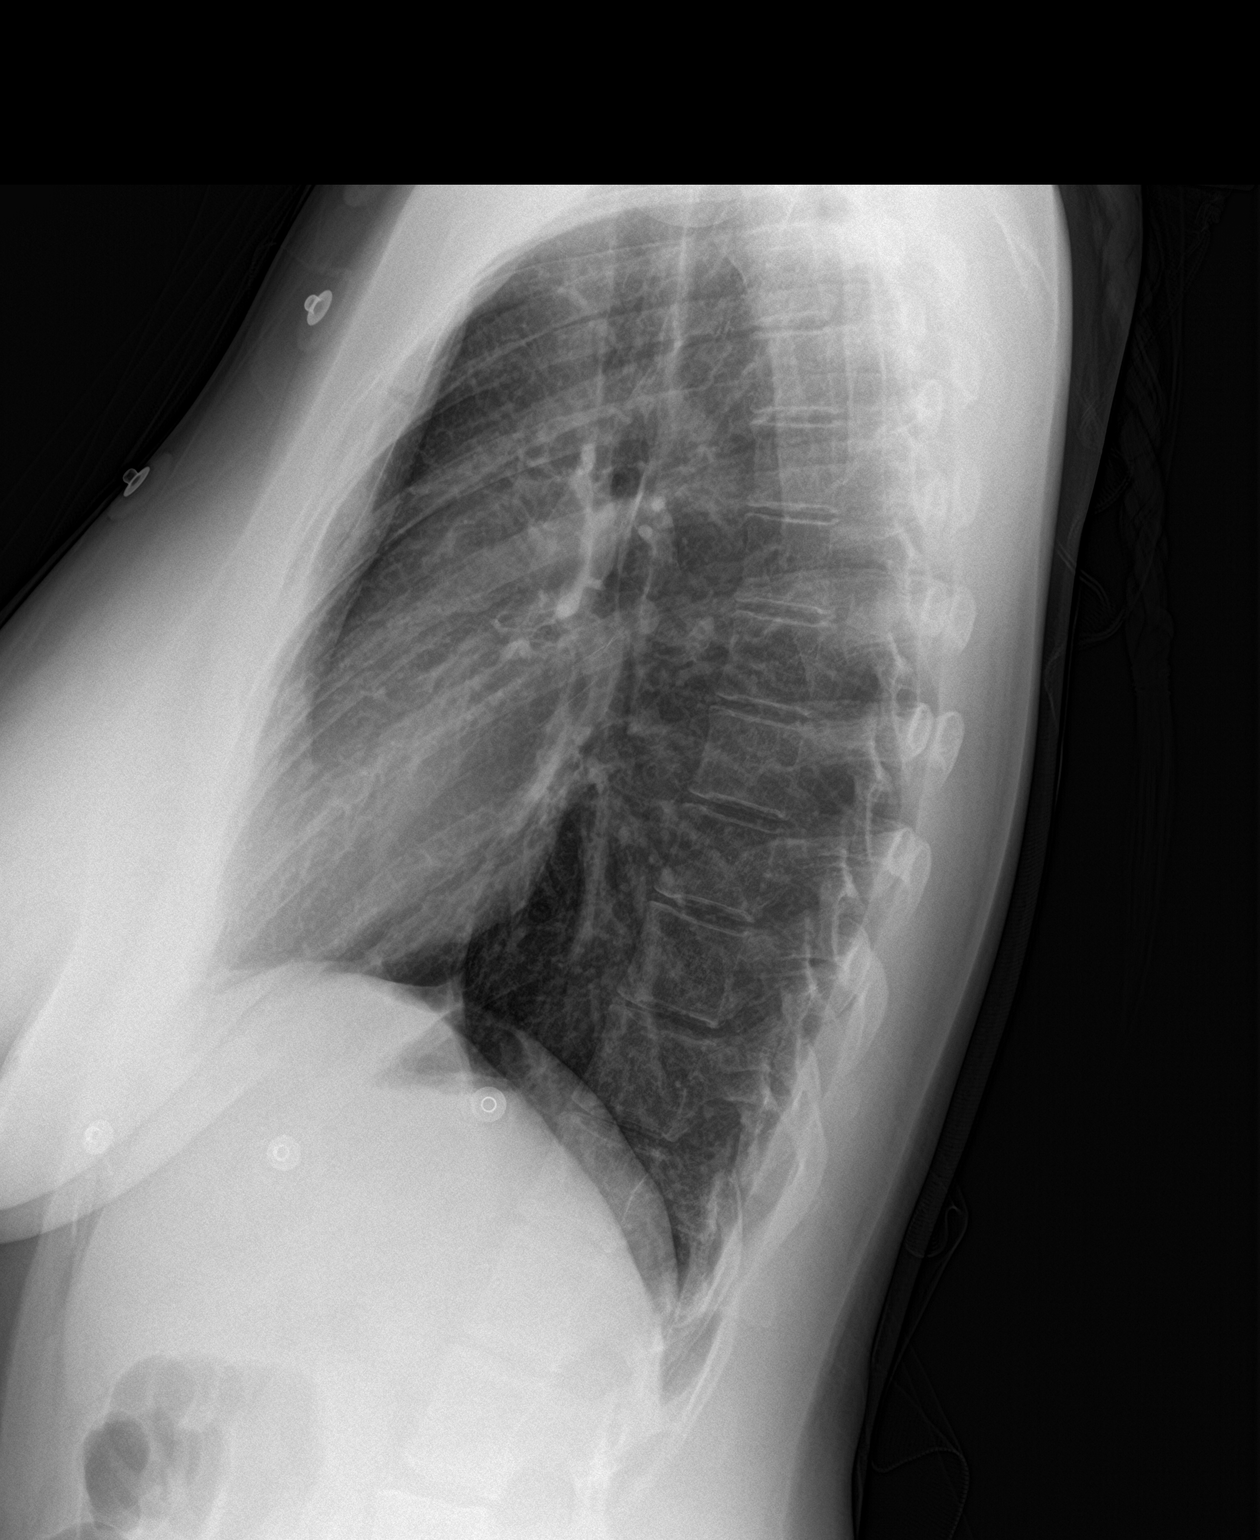

[2 of 2 positions shown; findings below may reference images not displayed]

FINDINGS: Heart and mediastinal contours are within normal limits. No focal
opacities or effusions. No acute bony abnormality.
IMPRESSION: No active cardiopulmonary disease.

## 2021-11-13 ENCOUNTER — Other Ambulatory Visit: Payer: Self-pay | Admitting: Internal Medicine

## 2021-11-13 DIAGNOSIS — J4540 Moderate persistent asthma, uncomplicated: Secondary | ICD-10-CM

## 2021-11-13 LAB — SARS CORONAVIRUS 2 (TAT 6-24 HRS): SARS Coronavirus 2: NEGATIVE

## 2021-11-16 ENCOUNTER — Encounter: Payer: Self-pay | Admitting: Internal Medicine

## 2021-11-16 ENCOUNTER — Ambulatory Visit (INDEPENDENT_AMBULATORY_CARE_PROVIDER_SITE_OTHER): Payer: 59 | Admitting: Internal Medicine

## 2021-11-16 ENCOUNTER — Other Ambulatory Visit: Payer: Self-pay

## 2021-11-16 VITALS — BP 106/80 | HR 99 | Temp 98.6°F | Ht 63.0 in | Wt 159.8 lb

## 2021-11-16 DIAGNOSIS — J4540 Moderate persistent asthma, uncomplicated: Secondary | ICD-10-CM

## 2021-11-16 DIAGNOSIS — J301 Allergic rhinitis due to pollen: Secondary | ICD-10-CM

## 2021-11-16 DIAGNOSIS — F17209 Nicotine dependence, unspecified, with unspecified nicotine-induced disorders: Secondary | ICD-10-CM | POA: Diagnosis not present

## 2021-11-16 LAB — PULMONARY FUNCTION TEST
FEF 25-75 Post: 1.98 L/sec
FEF 25-75 Pre: 1.72 L/sec
FEF2575-%Change-Post: 15 %
FEF2575-%Pred-Post: 54 %
FEF2575-%Pred-Pre: 46 %
FEV1-%Change-Post: 6 %
FEV1-%Pred-Post: 75 %
FEV1-%Pred-Pre: 71 %
FEV1-Post: 2.43 L
FEV1-Pre: 2.29 L
FEV1FVC-%Change-Post: 2 %
FEV1FVC-%Pred-Pre: 82 %
FEV6-%Change-Post: -2 %
FEV6-%Pred-Post: 85 %
FEV6-%Pred-Pre: 88 %
FEV6-Post: 3.15 L
FEV6-Pre: 3.23 L
FEV6FVC-%Pred-Post: 99 %
FEV6FVC-%Pred-Pre: 99 %
FVC-%Change-Post: 3 %
FVC-%Pred-Post: 91 %
FVC-%Pred-Pre: 88 %
FVC-Post: 3.34 L
FVC-Pre: 3.23 L
Post FEV1/FVC ratio: 73 %
Post FEV6/FVC ratio: 100 %
Pre FEV1/FVC ratio: 71 %
Pre FEV6/FVC Ratio: 100 %

## 2021-11-16 LAB — POCT EXHALED NITRIC OXIDE: FeNO level (ppb): 10

## 2021-11-16 MED ORDER — MONTELUKAST SODIUM 10 MG PO TABS: 10.0000 mg | ORAL_TABLET | Freq: Every day | ORAL | 11 refills | Status: AC

## 2021-11-16 NOTE — Progress Notes (Signed)
Spirometry pre and post done today. 

## 2021-11-16 NOTE — Progress Notes (Signed)
Ashley Chen    [id number]    12/30/2000  Primary Care Physician:Ramachandran, Campbell Lerner, MD Date of Appointment: 11/16/2021 Established Patient Visit  Chief complaint:   Chief Complaint  Patient presents with   Follow-up    F/u after PFT.  Her to get test results.     HPI: Ashley Chen is a 21 y.o. woman with asthma and nicotine dependence with vaping.   Interval Updates: Here for follow up after starting symbicort with spacer.  She has also had PFTs done which shows normal spirometry.   Has been vaping due to stress from her grandmothers dying.  Has been on wellbutrin for the last month and a half. Doing well, no side effects  She has a psychiatrist and therapist she works with for her PTSD and stress.   Current Regimen: symbicort BID, using albuterol prn  Asthma Triggers: allergies, stress, exercise Exacerbations in the last year: 2 in 2022 History of hospitalization or intubation: never Allergy Testing: GERD: denies Allergic Rhinitis: yes on flonase and cetirizine ACT:  Asthma Control Test ACT Total Score  11/16/2021 21   FeNO: 10 ppb  She is currently working as a Social worker.    I have reviewed the patient's family social and past medical history and updated as appropriate.   Past Medical History:  Diagnosis Date   Anxiety    Asthma    Depression    Migraine    Seasonal allergies     Past Surgical History:  Procedure Laterality Date   FOOT SURGERY     labial surgery     TONSILECTOMY, ADENOIDECTOMY, BILATERAL MYRINGOTOMY AND TUBES     before age 89   TONSILLECTOMY AND ADENOIDECTOMY     umblical hernia repair     before age 75    Family History  Problem Relation Age of Onset   Drug abuse Mother    Anxiety disorder Mother    ADD / ADHD Brother    Mental retardation Brother    Depression Paternal Uncle    Alcohol abuse Paternal Uncle     Social History   Occupational History   Not on file  Tobacco Use   Smoking status:  Every Day    Types: E-cigarettes   Smokeless tobacco: Never   Tobacco comments:    Trying to quit e-cigarettes.  Cut back a lot.  Vaping Use   Vaping Use: Every day   Substances: Nicotine, Flavoring  Substance and Sexual Activity   Alcohol use: Yes    Alcohol/week: 3.0 standard drinks    Types: 3 Shots of liquor per week    Comment: once a month   Drug use: Yes    Types: Marijuana, Nitrous oxide    Comment: xanax last use in April 2018; marijuana use weekly as of 09/16/17   Sexual activity: Yes    Partners: Male    Birth control/protection: I.U.D.     Physical Exam: Blood pressure 106/80, pulse 99, temperature 98.6 F (37 C), temperature source Oral, height 5\' 3"  (1.6 m), weight 159 lb 12.8 oz (72.5 kg), SpO2 99 %.  Gen:      No acute distress ENT:  no nasal polyps, mucus membranes moist Lungs:    No increased respiratory effort, symmetric chest wall excursion, clear to auscultation bilaterally, no wheezes or crackles CV:         Regular rate and rhythm; no murmurs, rubs, or gallops.  No pedal edema   Data  Reviewed: Imaging: I have personally reviewed the   PFTs:  PFT Results Latest Ref Rng & Units 11/16/2021  FVC-Pre L 3.23  FVC-Predicted Pre % 88  FVC-Post L 3.34  FVC-Predicted Post % 91  Pre FEV1/FVC % % 71  Post FEV1/FCV % % 73  FEV1-Pre L 2.29  FEV1-Predicted Pre % 71  FEV1-Post L 2.43   I have personally reviewed the patient's PFTs and spirometry shows normal pulmonary function. No significant bronchodiator response  Labs:  Immunization status:  There is no immunization history on file for this patient.  External Records Personally Reviewed:   Assessment:  Moderate perisistent asthma Nicotine dependence with vaping. On wellbutrin  Seasonal allergic rhinitis  Plan/Recommendations: Continue symbicort and prn albuterol Continue zyrtec and flonase. Add singulair for rhinitis and asthma  Discussed coming down on % of nicotine in her vape pods to  help taper off.  Continue wellbutrin. Follow up with therapist regarding healthy coping strategies for stress.   Return to Care: Return in about 6 months (around 05/16/2022).   Durel Salts, MD Pulmonary and Critical Care Medicine [facility] Office:[phone]

## 2021-11-16 NOTE — Patient Instructions (Addendum)
Please schedule follow up scheduled with myself in 6 months.  If my schedule is not open yet, we will contact you with a reminder closer to that time. Please call [phone] if you haven't heard from [place] a month before.   Cut down on your vaping by reducing the percentage in the pod. Go down from 5% to 3% to 0%.  Find healthy coping strategies for your stress with your therapist.   Continue symbicort twice daily with albuterol.  Take the albuterol rescue inhaler every 4 to 6 hours as needed for wheezing or shortness of breath. You can also take it 15 minutes before exercise or exertional activity. Side effects include heart racing or pounding, jitters or anxiety. If you have a history of an irregular heart rhythm, it can make this worse. Can also give some patients a hard time sleeping.  Start taking singulair once a day during allergy season

## 2021-12-08 ENCOUNTER — Encounter: Payer: Self-pay | Admitting: Behavioral Health

## 2021-12-08 ENCOUNTER — Ambulatory Visit (INDEPENDENT_AMBULATORY_CARE_PROVIDER_SITE_OTHER): Payer: 59 | Admitting: Behavioral Health

## 2021-12-08 ENCOUNTER — Other Ambulatory Visit: Payer: Self-pay

## 2021-12-08 DIAGNOSIS — F902 Attention-deficit hyperactivity disorder, combined type: Secondary | ICD-10-CM | POA: Diagnosis not present

## 2021-12-08 DIAGNOSIS — F330 Major depressive disorder, recurrent, mild: Secondary | ICD-10-CM | POA: Diagnosis not present

## 2021-12-08 DIAGNOSIS — F411 Generalized anxiety disorder: Secondary | ICD-10-CM | POA: Diagnosis not present

## 2021-12-08 DIAGNOSIS — F4312 Post-traumatic stress disorder, chronic: Secondary | ICD-10-CM | POA: Diagnosis not present

## 2021-12-08 DIAGNOSIS — F410 Panic disorder [episodic paroxysmal anxiety] without agoraphobia: Secondary | ICD-10-CM

## 2021-12-08 MED ORDER — HYDROXYZINE PAMOATE 50 MG PO CAPS: 50.0000 mg | ORAL_CAPSULE | Freq: Every evening | ORAL | 2 refills | Status: DC | PRN

## 2021-12-08 MED ORDER — BUPROPION HCL ER (XL) 300 MG PO TB24: 300.0000 mg | ORAL_TABLET | Freq: Every day | ORAL | 1 refills | Status: DC

## 2021-12-08 MED ORDER — DESVENLAFAXINE SUCCINATE ER 100 MG PO TB24: 100.0000 mg | ORAL_TABLET | Freq: Every day | ORAL | 1 refills | Status: DC

## 2021-12-08 MED ORDER — ALPRAZOLAM 0.5 MG PO TABS: 0.5000 mg | ORAL_TABLET | Freq: Three times a day (TID) | ORAL | 2 refills | Status: DC | PRN

## 2021-12-08 NOTE — Progress Notes (Signed)
[facility] ? ?Patient ID: [facility],  ?MRN: [id number] ? ?PCP: Georgianne Fick, MD ? ?Date of Evaluation: 12/08/2021 ?Time spent:30 minutes ? ?Chief Complaint:  ?Chief Complaint   ?Anxiety; Depression; Family Problem; Stress; Trauma; Follow-up; Medication Refill; Panic Attack ?  ? ? ?HISTORY/CURRENT STATUS: ?HPI ? ?21 year old female presents to this office for follow up and medication management. She says, "my life has been a shit show lately".  She says that her other remaining grandmother passed since our last visit and said her family has been torn apart aguiing over the will. Said that drama even occurred at the [place]. She understands it is situational but she has had more anxiety and depression along with panic attacks. Her relationship with her boyfriend is going great.  Pulmonology place her on Wellbutrin to help with quitting vaping. She reports her anxiety today at 4/10 and depression at 3/10. She says that she is sleeping on the mirtazapine 15 mg very well but still worries about weight gain. She was 153 today with light clothing.  She wants to try to reduce some of her medications int the near future but does need help now with the depression.She believes her current medication regimen is working well and does not want to make any changes at this time. Reports no mania, no psychosis. Does not have SI or HI.  ?  ?Prior Known Psychiatric medication trials: ?Prozac ?Zoloft ?Wellbutrin ?Mirtazapine (Worked except for excessive weight gain) ?Klonopin ?Buspar ?Prazosin ?Intuniv ?Concerta ?  ?  ? ? ?ndividual Medical History/ Review of Systems: Changes? :No  ? ?Allergies: Horse-derived products, Lactose intolerance (gi), and Other ? ?Current Medications:  ?Current Outpatient Medications:  ?  albuterol (VENTOLIN HFA) 108 (90 Base) MCG/ACT inhaler, INHALE 1-2 PUFFS INTO THE LUNGS EVERY 6 HOURS AS NEEDED FOR WHEEZING/SHORTNESS OF BREATH, Disp: 6.7 g, Rfl: 0 ?  budesonide-formoterol  (SYMBICORT) 160-4.5 MCG/ACT inhaler, Inhale 2 puffs into the lungs in the morning and at bedtime., Disp: 1 each, Rfl: 6 ?  buPROPion (WELLBUTRIN SR) 150 MG 12 hr tablet, Take 1 tablet (150 mg total) by mouth 2 (two) times daily., Disp: 60 tablet, Rfl: 3 ?  buPROPion (WELLBUTRIN XL) 300 MG 24 hr tablet, Take 1 tablet (300 mg total) by mouth daily., Disp: 30 tablet, Rfl: 1 ?  EPINEPHrine 0.3 mg/0.3 mL IJ SOAJ injection, Inject 0.3 mg into the muscle once as needed (severe allergic reaction)., Disp: , Rfl:  ?  levonorgestrel (MIRENA) 20 MCG/24HR IUD, 1 each by Intrauterine route once. Implanted November 2021, Disp: , Rfl:  ?  mirtazapine (REMERON) 15 MG tablet, Take 1 tablet (15 mg total) by mouth at bedtime., Disp: 90 tablet, Rfl: 1 ?  montelukast (SINGULAIR) 10 MG tablet, Take 1 tablet (10 mg total) by mouth at bedtime., Disp: 30 tablet, Rfl: 11 ?  ALPRAZolam (XANAX) 0.5 MG tablet, Take 1 tablet (0.5 mg total) by mouth 3 (three) times daily as needed., Disp: 90 tablet, Rfl: 2 ?  desvenlafaxine (PRISTIQ) 100 MG 24 hr tablet, Take 1 tablet (100 mg total) by mouth at bedtime., Disp: 90 tablet, Rfl: 1 ?  hydrOXYzine (VISTARIL) 50 MG capsule, Take 1 capsule (50 mg total) by mouth at bedtime as needed for anxiety (insomnia; Panic attack)., Disp: 90 capsule, Rfl: 2 ?Medication Side Effects: none ? ?Family Medical/ Social History: Changes? No ? ?MENTAL HEALTH EXAM: ? ?There were no vitals taken for this visit.There is no height or weight on file to calculate BMI.  ?General  Appearance: Casual and Neat  ?Eye Contact:  Good  ?Speech:  Clear and Coherent  ?Volume:  Normal  ?Mood:  NA  ?Affect:  Appropriate  ?Thought Process:  Coherent  ?Orientation:  Full (Time, Place, and Person)  ?Thought Content: Logical   ?Suicidal Thoughts:  No  ?Homicidal Thoughts:  No  ?Memory:  WNL  ?Judgement:  Good  ?Insight:  Good  ?Psychomotor Activity:  Normal  ?Concentration:  Concentration: Good  ?Recall:  Good  ?Fund of Knowledge: Good   ?Language: Good  ?Assets:  Desire for Improvement  ?ADL's:  Intact  ?Cognition: WNL  ?Prognosis:  Good  ? ? ?DIAGNOSES:  ?  ICD-10-CM   ?1. Chronic post-traumatic stress disorder  F43.12 buPROPion (WELLBUTRIN XL) 300 MG 24 hr tablet  ?  ALPRAZolam (XANAX) 0.5 MG tablet  ?  hydrOXYzine (VISTARIL) 50 MG capsule  ?  desvenlafaxine (PRISTIQ) 100 MG 24 hr tablet  ?  ?2. Generalized anxiety disorder  F41.1 buPROPion (WELLBUTRIN XL) 300 MG 24 hr tablet  ?  ?3. Attention deficit hyperactivity disorder (ADHD), combined type, moderate  F90.2 buPROPion (WELLBUTRIN XL) 300 MG 24 hr tablet  ?  desvenlafaxine (PRISTIQ) 100 MG 24 hr tablet  ?  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  ?5. Panic disorder  F41.0 ALPRAZolam (XANAX) 0.5 MG tablet  ?  hydrOXYzine (VISTARIL) 50 MG capsule  ?  desvenlafaxine (PRISTIQ) 100 MG 24 hr tablet  ?  ? ? ?Receiving Psychotherapy: Yes  ? ? ?RECOMMENDATIONS:  ? ?Continue Xanax 0.5 mg 3 times daily prn for panic attacks ?Continue Mirtazapine 15 mg at bedtime ?Continue Pristiq 100 mg at bedtime ?Continue hydroxyzine 50 mg three times daily prn ?Increase Wellbutrin to 300 mg XL daily ?To report any severe side effects promptly ?Provided emergency contact and after hour information ?To follow up in 6 weeks for reassessment. ?Greater than 50%  Of 30 min. face to face time with patient was spent on counseling and coordination of care. Discussed her recent progress with medications. Pulmonology place her on Wellbutrin for vaping cravings. [place] Spaniel it helped a little bit for a while. Depression she is experiencing is situational stemming from death of grandmother and dysfunctional family dynamics. Educated her on latest research on vaping and its possible effects. No changes to medication regimen recommended at this time. Refills escribed to patients pharmacy. ?Refills provided today ?PDMP reviewed. ?  ? ? ? ?Joan Flores, NP  ?

## 2022-01-19 ENCOUNTER — Encounter: Payer: Self-pay | Admitting: Behavioral Health

## 2022-01-19 ENCOUNTER — Ambulatory Visit (INDEPENDENT_AMBULATORY_CARE_PROVIDER_SITE_OTHER): Payer: 59 | Admitting: Behavioral Health

## 2022-01-19 VITALS — Wt 151.8 lb

## 2022-01-19 DIAGNOSIS — F4312 Post-traumatic stress disorder, chronic: Secondary | ICD-10-CM

## 2022-01-19 DIAGNOSIS — F410 Panic disorder [episodic paroxysmal anxiety] without agoraphobia: Secondary | ICD-10-CM | POA: Diagnosis not present

## 2022-01-19 DIAGNOSIS — Z9189 Other specified personal risk factors, not elsewhere classified: Secondary | ICD-10-CM

## 2022-01-19 DIAGNOSIS — F411 Generalized anxiety disorder: Secondary | ICD-10-CM

## 2022-01-19 DIAGNOSIS — F4310 Post-traumatic stress disorder, unspecified: Secondary | ICD-10-CM

## 2022-01-19 DIAGNOSIS — G4700 Insomnia, unspecified: Secondary | ICD-10-CM

## 2022-01-19 MED ORDER — BUPROPION HCL ER (SR) 150 MG PO TB12: 150.0000 mg | ORAL_TABLET | Freq: Two times a day (BID) | ORAL | 3 refills | Status: DC

## 2022-01-19 MED ORDER — ALPRAZOLAM 0.5 MG PO TABS: 0.5000 mg | ORAL_TABLET | Freq: Three times a day (TID) | ORAL | 2 refills | Status: DC | PRN

## 2022-01-19 MED ORDER — EPINEPHRINE 0.3 MG/0.3ML IJ SOAJ: 0.3000 mg | Freq: Once | INTRAMUSCULAR | 1 refills | Status: AC | PRN

## 2022-01-19 MED ORDER — MIRTAZAPINE 15 MG PO TABS: 15.0000 mg | ORAL_TABLET | Freq: Every day | ORAL | 2 refills | Status: DC
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
## 2022-02-10 ENCOUNTER — Other Ambulatory Visit: Payer: Self-pay | Admitting: Behavioral Health

## 2022-02-10 DIAGNOSIS — F902 Attention-deficit hyperactivity disorder, combined type: Secondary | ICD-10-CM

## 2022-02-10 DIAGNOSIS — F4312 Post-traumatic stress disorder, chronic: Secondary | ICD-10-CM

## 2022-02-10 DIAGNOSIS — F330 Major depressive disorder, recurrent, mild: Secondary | ICD-10-CM

## 2022-02-10 DIAGNOSIS — F411 Generalized anxiety disorder: Secondary | ICD-10-CM

## 2022-04-20 ENCOUNTER — Ambulatory Visit: Payer: 59 | Admitting: Behavioral Health

## 2022-04-26 ENCOUNTER — Encounter: Payer: Self-pay | Admitting: Behavioral Health

## 2022-04-26 ENCOUNTER — Ambulatory Visit: Payer: 59 | Admitting: Behavioral Health

## 2022-04-26 DIAGNOSIS — F902 Attention-deficit hyperactivity disorder, combined type: Secondary | ICD-10-CM

## 2022-04-26 DIAGNOSIS — F330 Major depressive disorder, recurrent, mild: Secondary | ICD-10-CM

## 2022-04-26 DIAGNOSIS — F4312 Post-traumatic stress disorder, chronic: Secondary | ICD-10-CM

## 2022-04-26 DIAGNOSIS — F411 Generalized anxiety disorder: Secondary | ICD-10-CM | POA: Diagnosis not present

## 2022-04-26 DIAGNOSIS — G4700 Insomnia, unspecified: Secondary | ICD-10-CM

## 2022-04-26 DIAGNOSIS — F410 Panic disorder [episodic paroxysmal anxiety] without agoraphobia: Secondary | ICD-10-CM

## 2022-04-26 MED ORDER — DESVENLAFAXINE SUCCINATE ER 100 MG PO TB24: 100.0000 mg | ORAL_TABLET | Freq: Every day | ORAL | 2 refills | Status: DC

## 2022-04-26 MED ORDER — HYDROXYZINE PAMOATE 50 MG PO CAPS: 50.0000 mg | ORAL_CAPSULE | Freq: Every evening | ORAL | 2 refills | Status: DC | PRN

## 2022-04-26 MED ORDER — BUPROPION HCL ER (XL) 300 MG PO TB24: 300.0000 mg | ORAL_TABLET | Freq: Every day | ORAL | 3 refills | Status: DC

## 2022-04-26 MED ORDER — MIRTAZAPINE 15 MG PO TABS: 15.0000 mg | ORAL_TABLET | Freq: Every day | ORAL | 1 refills | Status: DC

## 2022-04-26 NOTE — Progress Notes (Signed)
[facility]  Patient ID: Ashley Chen,  MRN: [id number]  PCP: Georgianne Fick, MD  Date of Evaluation: 04/26/2022 Time spent:20 minutes  Chief Complaint:  Chief Complaint   Anxiety; Depression; Follow-up; Medication Refill; Patient Education     HISTORY/CURRENT STATUS: HPI  21 year old female presents to this office for follow up and medication management. She says, "family drama is all the same". Her relationship with her boyfriend is going great. She has cut back on her vaping. She reports her anxiety today at 3/10 and depression at 2/10. She says that she is sleeping on the mirtazapine 15 mg very well but still worries about weight gain so recently she has been taking 1/2 tablet 7.5 mg. She was 148  today with light clothing.  She wants to try to reduce some of her medications int the near future but does need help now with the depression.She believes her current medication regimen is working well and does not want to make any changes at this time. Reports no mania, no psychosis. Does not have SI or HI.    Prior Known Psychiatric medication trials: Prozac Zoloft Wellbutrin Mirtazapine (Worked except for excessive weight gain) Klonopin Buspar Prazosin Intuniv Concerta    Individual Medical History/ Review of Systems: Changes? :No   Allergies: Horse-derived products, Lactose intolerance (gi), and Other  Current Medications:  Current Outpatient Medications:    albuterol (VENTOLIN HFA) 108 (90 Base) MCG/ACT inhaler, INHALE 1-2 PUFFS INTO THE LUNGS EVERY 6 HOURS AS NEEDED FOR WHEEZING/SHORTNESS OF BREATH, Disp: 6.7 g, Rfl: 0   ALPRAZolam (XANAX) 0.5 MG tablet, Take 1 tablet (0.5 mg total) by mouth 3 (three) times daily as needed., Disp: 90 tablet, Rfl: 2   budesonide-formoterol (SYMBICORT) 160-4.5 MCG/ACT inhaler, Inhale 2 puffs into the lungs in the morning and at bedtime., Disp: 1 each, Rfl: 6   buPROPion (WELLBUTRIN SR) 150 MG 12 hr tablet, Take 1  tablet (150 mg total) by mouth 2 (two) times daily., Disp: 60 tablet, Rfl: 3   buPROPion (WELLBUTRIN XL) 300 MG 24 hr tablet, Take 1 tablet (300 mg total) by mouth daily., Disp: 30 tablet, Rfl: 3   desvenlafaxine (PRISTIQ) 100 MG 24 hr tablet, Take 1 tablet (100 mg total) by mouth at bedtime., Disp: 90 tablet, Rfl: 2   EPINEPHrine 0.3 mg/0.3 mL IJ SOAJ injection, Inject 0.3 mg into the muscle once as needed (severe allergic reaction)., Disp: 1 each, Rfl: 1   hydrOXYzine (VISTARIL) 50 MG capsule, Take 1 capsule (50 mg total) by mouth at bedtime as needed for anxiety (insomnia; Panic attack)., Disp: 90 capsule, Rfl: 2   levonorgestrel (MIRENA) 20 MCG/24HR IUD, 1 each by Intrauterine route once. Implanted November 2021, Disp: , Rfl:    mirtazapine (REMERON) 15 MG tablet, Take 1 tablet (15 mg total) by mouth at bedtime., Disp: 90 tablet, Rfl: 1   montelukast (SINGULAIR) 10 MG tablet, Take 1 tablet (10 mg total) by mouth at bedtime., Disp: 30 tablet, Rfl: 11 Medication Side Effects: none  Family Medical/ Social History: Changes? No  MENTAL HEALTH EXAM:  Weight 148 lb (67.1 kg).Body mass index is 26.22 kg/m.  General Appearance: Casual, Neat, and Well Groomed  Eye Contact:  Good  Speech:  Clear and Coherent  Volume:  Normal  Mood:  NA  Affect:  Appropriate  Thought Process:  Coherent  Orientation:  Full (Time, Place, and Person)  Thought Content: Logical   Suicidal Thoughts:  No  Homicidal Thoughts:  No  Memory:  WNL  Judgement:  Good  Insight:  Good  Psychomotor Activity:  Normal  Concentration:  Concentration: Good  Recall:  Good  Fund of Knowledge: Good  Language: Good  Assets:  Desire for Improvement  ADL's:  Intact  Cognition: WNL  Prognosis:  Good    DIAGNOSES:    ICD-10-CM   1. Chronic post-traumatic stress disorder  F43.12 desvenlafaxine (PRISTIQ) 100 MG 24 hr tablet    buPROPion (WELLBUTRIN XL) 300 MG 24 hr tablet    hydrOXYzine (VISTARIL) 50 MG capsule    mirtazapine  (REMERON) 15 MG tablet    2. Attention deficit hyperactivity disorder (ADHD), combined type, moderate  F90.2 desvenlafaxine (PRISTIQ) 100 MG 24 hr tablet    buPROPion (WELLBUTRIN XL) 300 MG 24 hr tablet    3. Panic disorder  F41.0 desvenlafaxine (PRISTIQ) 100 MG 24 hr tablet    hydrOXYzine (VISTARIL) 50 MG capsule    4. 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 Insomnia, unspecified type  G47.00 mirtazapine (REMERON) 15 MG tablet      Receiving Psychotherapy: No    RECOMMENDATIONS:     Continue Xanax 0.5 mg 3 times daily prn for panic attacks Continue Mirtazapine 15 mg at bedtime Continue Pristiq 100 mg at bedtime Continue hydroxyzine 50 mg three times daily prn Increase Wellbutrin to 300 mg XL daily To report any severe side effects promptly Provided emergency contact and after hour information To follow up in 3 months for reassessment. Greater than 50%  of 20 min. face to face time with patient was spent on counseling and coordination of care. No change this visit. No social changes. Discussed her recent progress with medications. Depression she is experiencing is situational stemming from death of grandmother and dysfunctional family dynamics. She said everything is still the same with family drama. No changes to medication regimen recommended at this time. Refills escribed to patients pharmacy. Refills provided today PDMP reviewed.         Joan Flores, NP

## 2022-06-30 ENCOUNTER — Other Ambulatory Visit: Payer: Self-pay | Admitting: Behavioral Health

## 2022-06-30 DIAGNOSIS — F410 Panic disorder [episodic paroxysmal anxiety] without agoraphobia: Secondary | ICD-10-CM

## 2022-06-30 DIAGNOSIS — F4312 Post-traumatic stress disorder, chronic: Secondary | ICD-10-CM

## 2022-07-10 ENCOUNTER — Ambulatory Visit (INDEPENDENT_AMBULATORY_CARE_PROVIDER_SITE_OTHER): Payer: 59

## 2022-07-10 ENCOUNTER — Ambulatory Visit
Admission: EM | Admit: 2022-07-10 | Discharge: 2022-07-10 | Disposition: A | Discharge status: Home / Self Care | Payer: 59 | Attending: Family Medicine | Admitting: Family Medicine

## 2022-07-10 DIAGNOSIS — R062 Wheezing: Secondary | ICD-10-CM

## 2022-07-10 DIAGNOSIS — R059 Cough, unspecified: Secondary | ICD-10-CM

## 2022-07-10 DIAGNOSIS — J4521 Mild intermittent asthma with (acute) exacerbation: Secondary | ICD-10-CM

## 2022-07-10 MED ORDER — METHYLPREDNISOLONE ACETATE 80 MG/ML IJ SUSP
80.0000 mg | Freq: Once | INTRAMUSCULAR | Status: AC
  Administered 2022-07-10: 80 mg via INTRAMUSCULAR

## 2022-07-10 MED ORDER — CHERATUSSIN AC 100-10 MG/5ML PO SOLN: 5.0000 mL | Freq: Four times a day (QID) | ORAL | 0 refills | Status: DC | PRN

## 2022-07-10 MED ORDER — PREDNISONE 20 MG PO TABS: 40.0000 mg | ORAL_TABLET | Freq: Every day | ORAL | 0 refills | Status: AC

## 2022-07-10 MED ORDER — BENZONATATE 100 MG PO CAPS: 100.0000 mg | ORAL_CAPSULE | Freq: Three times a day (TID) | ORAL | 0 refills | Status: DC | PRN

## 2022-07-10 NOTE — Discharge Instructions (Addendum)
Chest x-ray was clear and did not show any pneumonia or fluid  Take benzonatate 100 mg, 1 tab every 8 hours as needed for cough.  Take prednisone 20 mg--2 daily for 5 days  Take Robitussin with codeine cough syrup--1 teaspoon or 5 mL every 6 hours as needed for cough.  This medication can make you sleepy   You are given a shot of Depo-Medrol 80 mg here in the office

## 2022-07-10 NOTE — ED Triage Notes (Signed)
Pt has had cough x 3 days . Pt has hx of asthma, pt also being treated for sinus infection took last does last night. No fevrs, no chills

## 2022-07-10 NOTE — ED Provider Notes (Signed)
[facility]    CSN: [id number] Arrival date & time: 07/10/22  1116      History   Chief Complaint Chief Complaint  Patient presents with   Cough    HPI Ashley Chen is a 21 y.o. female.    Cough  Here for cough.  Is been going on for 5 to 6 days, but then has been worse in the last 2 or 3 days.  No recent fever or chills.  She did start antibiotics on October 4 for a sinus infection that she just finished about a day or 2 ago.  No nausea or vomiting.  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 Authorizing Provider  benzonatate (TESSALON) 100 MG capsule Take 1 capsule (100 mg total) by mouth 3 (three) times daily as needed for cough. 07/10/22  Yes Junice Fei, Janace Aris, MD  guaiFENesin-codeine (CHERATUSSIN AC) 100-10 MG/5ML syrup Take 5 mLs by mouth 4 (four) times daily as needed for cough. 07/10/22  Yes Edmund Rick, Janace Aris, MD   predniSONE (DELTASONE) 20 MG tablet Take 2 tablets (40 mg total) by mouth daily with breakfast for 5 days. 07/10/22 07/15/22 Yes Zenia Resides, MD  albuterol (VENTOLIN HFA) 108 (90 Base) MCG/ACT inhaler INHALE 1-2 PUFFS INTO THE LUNGS EVERY 6 HOURS AS NEEDED FOR WHEEZING/SHORTNESS OF BREATH 06/19/19   Chauncey Mann, MD  ALPRAZolam Prudy Feeler) 0.5 MG tablet TAKE 1 TABLET BY MOUTH 3 TIMES DAILY AS NEEDED. 06/30/22   Joan Flores, NP  budesonide-formoterol (SYMBICORT) 160-4.5 MCG/ACT inhaler Inhale 2 puffs into the lungs in the morning and at bedtime. 09/11/21   Charlott Holler, MD  buPROPion [facility] SR) 150 MG 12 hr tablet Take 1 tablet (150 mg total) by mouth 2 (two) times daily. 01/19/22   Joan Flores, NP  buPROPion (WELLBUTRIN XL) 300 MG 24 hr tablet Take 1 tablet (300 mg total) by mouth daily. 04/26/22   Joan Flores, NP  desvenlafaxine (PRISTIQ) 100 MG 24 hr tablet Take 1 tablet (100 mg total) by mouth at bedtime. 04/26/22   White, Watt Climes,  NP  EPINEPHrine 0.3 mg/0.3 mL IJ SOAJ injection Inject 0.3 mg into the muscle once as needed (severe allergic reaction). 01/19/22   Elwanda Brooklyn, NP  hydrOXYzine (VISTARIL) 50 MG capsule Take 1 capsule (50 mg total) by mouth at bedtime as needed for anxiety (insomnia; Panic attack). 04/26/22   Elwanda Brooklyn, NP  levonorgestrel (MIRENA) 20 MCG/24HR IUD 1 each by Intrauterine route once. Implanted November 2021    [provider]  mirtazapine (REMERON) 15 MG tablet Take 1 tablet (15 mg total) by mouth at bedtime. 04/26/22   Elwanda Brooklyn, NP  montelukast (SINGULAIR) 10 MG tablet Take 1 tablet (10 mg total) by mouth at bedtime. 11/16/21   Spero Geralds, MD    Family History Family History  Problem Relation Age of Onset   Drug abuse Mother    Anxiety disorder Mother    ADD / ADHD Brother    Mental retardation Brother    Depression Paternal Uncle    Alcohol abuse Paternal Uncle     Social History Social History   Tobacco Use    Smoking status: Every Day    Types: E-cigarettes   Smokeless tobacco: Never   Tobacco comments:    Trying to quit e-cigarettes.  Cut back a lot.  Vaping Use   Vaping Use: Every day   Substances: Nicotine, Flavoring  Substance Use Topics   Alcohol use: Yes    Alcohol/week: 3.0 standard drinks of alcohol    Types: 3 Shots of liquor per week    Comment: once a month   Drug use: Yes    Types: Marijuana, Nitrous oxide    Comment: xanax last use in April 2018; marijuana use weekly as of 09/16/17     Allergies   Horse-derived products, Lactose intolerance (gi), and Other   Review of Systems Review of Systems  Respiratory:  Positive for cough.      Physical Exam Triage Vital Signs ED Triage Vitals  Enc Vitals Group     BP 07/10/22 1221 (!) 134/91     Pulse Rate 07/10/22 1221 92     Resp 07/10/22 1221 16     Temp 07/10/22 1221 97.9 F (36.6 C)     Temp Source 07/10/22 1221 Oral     SpO2 07/10/22 1221 99 %     Weight --      Height --      Head Circumference --      Peak Flow --      Pain Score 07/10/22 1220 0     Pain Loc --      Pain Edu? --      Excl. in [place]? --    No data found.  Updated Vital Signs BP (!) 134/91 (BP Location: Left Arm)   Pulse 92   Temp 97.9 F (36.6 C) (Oral)   Resp 16   SpO2 99%   Visual Acuity Right Eye Distance:   Left Eye Distance:   Bilateral Distance:    Right Eye Near:   Left Eye Near:    Bilateral Near:     Physical Exam Vitals reviewed.  Constitutional:      General: She is not in acute distress.    Appearance: She is not toxic-appearing.  HENT:     Right Ear: Tympanic membrane and ear canal normal.     Left Ear: Tympanic membrane and ear canal normal.     Nose: Nose normal.     Mouth/Throat:  Mouth: Mucous membranes are moist.     Pharynx: No oropharyngeal exudate or posterior oropharyngeal erythema.  Eyes:     Extraocular Movements: Extraocular movements intact.     Conjunctiva/sclera: Conjunctivae normal.      Pupils: Pupils are equal, round, and reactive to light.  Cardiovascular:     Rate and Rhythm: Normal rate and regular rhythm.     Heart sounds: No murmur heard. Pulmonary:     Effort: Pulmonary effort is normal. No respiratory distress.     Breath sounds: No stridor. No wheezing, rhonchi or rales.     Comments: There are no wheezes at the time of exam, but the expiratory phase is prolonged.  Air movement is good.  She had just use her inhaler about 2 hours ago Musculoskeletal:     Cervical back: Neck supple.  Lymphadenopathy:     Cervical: No cervical adenopathy.  Skin:    Capillary Refill: Capillary refill takes less than 2 seconds.     Coloration: Skin is not jaundiced or pale.  Neurological:     General: No focal deficit present.     Mental Status: She is alert and oriented to person, place, and time.  Psychiatric:        Behavior: Behavior normal.      UC Treatments / Results  Labs (all labs ordered are listed, but only abnormal results are displayed) Labs Reviewed - No data to display  EKG   Radiology DG Chest 2 View  Result Date: 07/10/2022 CLINICAL DATA:  Cough and wheezing. EXAM: CHEST - 2 VIEW COMPARISON:  10/12/2019. FINDINGS: Normal heart, mediastinum and hila. Clear lungs.  No pleural effusion or pneumothorax. Skeletal structures are within normal limits. IMPRESSION: No active cardiopulmonary disease. Electronically Signed   By: Amie Portland M.D.   On: 07/10/2022 12:52    Procedures Procedures (including critical care time)  Medications Ordered in UC Medications  methylPREDNISolone acetate (DEPO-MEDROL) injection 80 mg (80 mg Intramuscular Given 07/10/22 1315)    Initial Impression / Assessment and Plan / UC Course  I have reviewed the triage vital signs and the nursing notes.  Pertinent labs & imaging results that were available during my care of the patient were reviewed by me and considered in my medical decision making (see chart for details).         Chest x-ray is clear, we will treat for asthma exacerbation them giving her a couple of different medications for cough, as she has to manage a wedding reception tomorrow Final Clinical Impressions(s) / UC Diagnoses   Final diagnoses:  Mild intermittent asthma with (acute) exacerbation     Discharge Instructions      Chest x-ray was clear and did not show any pneumonia or fluid  Take benzonatate 100 mg, 1 tab every 8 hours as needed for cough.  Take prednisone 20 mg--2 daily for 5 days  Take Robitussin with codeine cough syrup--1 teaspoon or 5 mL every 6 hours as needed for cough.  This medication can make you sleepy   You are given a shot of Depo-Medrol 80 mg here in the office     ED Prescriptions     Medication Sig Dispense Auth. Provider   predniSONE (DELTASONE) 20 MG tablet Take 2 tablets (40 mg total) by mouth daily with breakfast for 5 days. 10 tablet Zenia Resides, MD   benzonatate (TESSALON) 100 MG capsule Take 1 capsule (100 mg total) by mouth 3 (three) times daily as needed for cough. 21  capsule Zenia Resides, MD   guaiFENesin-codeine [facility]) 100-10 MG/5ML syrup Take 5 mLs by mouth 4 (four) times daily as needed for cough. 120 mL Zenia Resides, MD      I have reviewed the PDMP during this encounter.   Zenia Resides, MD 07/10/22 1315

## 2022-07-26 ENCOUNTER — Ambulatory Visit: Payer: 59 | Admitting: Behavioral Health

## 2022-08-31 ENCOUNTER — Encounter: Payer: Self-pay | Admitting: Behavioral Health

## 2022-08-31 ENCOUNTER — Ambulatory Visit: Payer: 59 | Admitting: Behavioral Health

## 2022-08-31 DIAGNOSIS — F330 Major depressive disorder, recurrent, mild: Secondary | ICD-10-CM | POA: Diagnosis not present

## 2022-08-31 DIAGNOSIS — F4312 Post-traumatic stress disorder, chronic: Secondary | ICD-10-CM

## 2022-08-31 DIAGNOSIS — F411 Generalized anxiety disorder: Secondary | ICD-10-CM

## 2022-08-31 DIAGNOSIS — F410 Panic disorder [episodic paroxysmal anxiety] without agoraphobia: Secondary | ICD-10-CM

## 2022-08-31 DIAGNOSIS — F902 Attention-deficit hyperactivity disorder, combined type: Secondary | ICD-10-CM | POA: Diagnosis not present

## 2022-08-31 DIAGNOSIS — G4700 Insomnia, unspecified: Secondary | ICD-10-CM

## 2022-08-31 MED ORDER — HYDROXYZINE PAMOATE 50 MG PO CAPS: 50.0000 mg | ORAL_CAPSULE | Freq: Every evening | ORAL | 2 refills | Status: DC | PRN

## 2022-08-31 MED ORDER — MIRTAZAPINE 15 MG PO TABS: 15.0000 mg | ORAL_TABLET | Freq: Every day | ORAL | 1 refills | Status: DC

## 2022-08-31 MED ORDER — BUPROPION HCL ER (XL) 300 MG PO TB24: 300.0000 mg | ORAL_TABLET | Freq: Every day | ORAL | 3 refills | Status: DC

## 2022-08-31 MED ORDER — DESVENLAFAXINE SUCCINATE ER 100 MG PO TB24: 100.0000 mg | ORAL_TABLET | Freq: Every day | ORAL | 2 refills | Status: DC

## 2022-08-31 NOTE — Progress Notes (Signed)
[facility]  Patient ID: Janeeka Puhl,  MRN: [id number]  PCP: Merrilee Seashore, MD  Date of Evaluation: 08/31/2022 Time spent:30 minutes  Chief Complaint:  Chief Complaint   Depression; Anxiety; Follow-up; Medication Refill; Patient Education     HISTORY/CURRENT STATUS: HPI 21 year old female presents to this office for follow up and medication management. Says that she has been troubled after losing a friend on a freak dirt bike incident. She is a little dismayed at life right now but holding her own. Her relationship with her boyfriend is going great. She has cut back on her vaping. She reports her anxiety today at 3/10 and depression at 4/10.  She wants to try to reduce some of her medications int the near future but does need help now with the depression.She believes her current medication regimen is working well and does not want to make any changes at this time. Reports no mania, no psychosis. Does not have SI or HI.    Prior Known Psychiatric medication trials: Prozac Zoloft Wellbutrin Mirtazapine (Worked except for excessive weight gain) Klonopin Buspar Prazosin Intuniv Concerta      Individual Medical History/ Review of Systems: Changes? :No   Allergies: Horse-derived products, Lactose intolerance (gi), and Other  Current Medications:  Current Outpatient Medications:    albuterol (VENTOLIN HFA) 108 (90 Base) MCG/ACT inhaler, INHALE 1-2 PUFFS INTO THE LUNGS EVERY 6 HOURS AS NEEDED FOR WHEEZING/SHORTNESS OF BREATH, Disp: 6.7 g, Rfl: 0   ALPRAZolam (XANAX) 0.5 MG tablet, TAKE 1 TABLET BY MOUTH 3 TIMES DAILY AS NEEDED., Disp: 90 tablet, Rfl: 2   benzonatate (TESSALON) 100 MG capsule, Take 1 capsule (100 mg total) by mouth 3 (three) times daily as needed for cough., Disp: 21 capsule, Rfl: 0   budesonide-formoterol (SYMBICORT) 160-4.5 MCG/ACT inhaler, Inhale 2 puffs into the lungs in the morning and at bedtime., Disp: 1 each, Rfl: 6   buPROPion  (WELLBUTRIN XL) 300 MG 24 hr tablet, Take 1 tablet (300 mg total) by mouth daily., Disp: 30 tablet, Rfl: 3   desvenlafaxine (PRISTIQ) 100 MG 24 hr tablet, Take 1 tablet (100 mg total) by mouth at bedtime., Disp: 90 tablet, Rfl: 2   EPINEPHrine 0.3 mg/0.3 mL IJ SOAJ injection, Inject 0.3 mg into the muscle once as needed (severe allergic reaction)., Disp: 1 each, Rfl: 1   guaiFENesin-codeine (CHERATUSSIN AC) 100-10 MG/5ML syrup, Take 5 mLs by mouth 4 (four) times daily as needed for cough., Disp: 120 mL, Rfl: 0   hydrOXYzine (VISTARIL) 50 MG capsule, Take 1 capsule (50 mg total) by mouth at bedtime as needed for anxiety (insomnia; Panic attack)., Disp: 90 capsule, Rfl: 2   levonorgestrel (MIRENA) 20 MCG/24HR IUD, 1 each by Intrauterine route once. Implanted November 2021, Disp: , Rfl:    mirtazapine (REMERON) 15 MG tablet, Take 1 tablet (15 mg total) by mouth at bedtime., Disp: 90 tablet, Rfl: 1   montelukast (SINGULAIR) 10 MG tablet, Take 1 tablet (10 mg total) by mouth at bedtime., Disp: 30 tablet, Rfl: 11 Medication Side Effects: none  Family Medical/ Social History: Changes? No  MENTAL HEALTH EXAM:  There were no vitals taken for this visit.There is no height or weight on file to calculate BMI.  General Appearance: Casual, Neat, and Well Groomed  Eye Contact:  Good  Speech:  Clear and Coherent  Volume:  Normal  Mood:  NA  Affect:  Appropriate  Thought Process:  Coherent  Orientation:  Full (Time, Place, and Person)  Thought Content: Logical   Suicidal Thoughts:  No  Homicidal Thoughts:  No  Memory:  WNL  Judgement:  Good  Insight:  Good  Psychomotor Activity:  Normal  Concentration:  Concentration: Good  Recall:  Good  Fund of Knowledge: Good  Language: Good  Assets:  Desire for Improvement  ADL's:  Intact  Cognition: WNL  Prognosis:  Good    DIAGNOSES:    ICD-10-CM   1. Generalized anxiety disorder  F41.1 buPROPion (WELLBUTRIN XL) 300 MG 24 hr tablet    mirtazapine  (REMERON) 15 MG tablet    2. Chronic post-traumatic stress disorder  F43.12 buPROPion (WELLBUTRIN XL) 300 MG 24 hr tablet    desvenlafaxine (PRISTIQ) 100 MG 24 hr tablet    hydrOXYzine (VISTARIL) 50 MG capsule    mirtazapine (REMERON) 15 MG tablet    3. Attention deficit hyperactivity disorder (ADHD), combined type, moderate  F90.2 buPROPion (WELLBUTRIN XL) 300 MG 24 hr tablet    desvenlafaxine (PRISTIQ) 100 MG 24 hr tablet    4. Mild episode of recurrent major depressive disorder (HCC)  F33.0 buPROPion (WELLBUTRIN XL) 300 MG 24 hr tablet    5. Panic disorder  F41.0 desvenlafaxine (PRISTIQ) 100 MG 24 hr tablet    hydrOXYzine (VISTARIL) 50 MG capsule    6. Insomnia, unspecified type  G47.00 mirtazapine (REMERON) 15 MG tablet      Receiving Psychotherapy: yes, [facility] G[facility]ze   RECOMMENDATIONS:   Continue Xanax 0.5 mg 3 times daily prn for panic attacks Continue Mirtazapine 15 mg at bedtime Continue Pristiq 100 mg at bedtime Continue hydroxyzine 50 mg three times daily prn Continue Wellbutrin to 300 mg XL daily To report any severe side effects promptly Provided emergency contact and after hour information To follow up in 6 months for reassessment per pt Greater than 50%  of 30 min. face to face time with patient was spent on counseling and coordination of care. No change this visit. No social changes. Discussed her recent progress with medications.  We discussed tragedy and death within family and friends. She is continuing with counseling.  . She said everything is still the same with family drama. No changes to medication regimen recommended at this time. Refills escribed to patients pharmacy. Refills provided today PDMP reviewed.       Joan Flores, NP

## 2022-09-30 ENCOUNTER — Other Ambulatory Visit: Payer: Self-pay | Admitting: Behavioral Health

## 2022-09-30 DIAGNOSIS — F410 Panic disorder [episodic paroxysmal anxiety] without agoraphobia: Secondary | ICD-10-CM

## 2022-09-30 DIAGNOSIS — F4312 Post-traumatic stress disorder, chronic: Secondary | ICD-10-CM

## 2022-10-01 NOTE — Telephone Encounter (Signed)
Filled 12/4 appt 6/5

## 2022-11-15 ENCOUNTER — Other Ambulatory Visit: Payer: Self-pay | Admitting: Internal Medicine

## 2022-11-15 DIAGNOSIS — J301 Allergic rhinitis due to pollen: Secondary | ICD-10-CM

## 2022-11-19 ENCOUNTER — Telehealth: Payer: Self-pay | Admitting: Behavioral Health

## 2022-11-19 ENCOUNTER — Other Ambulatory Visit: Payer: Self-pay | Admitting: Internal Medicine

## 2022-11-19 DIAGNOSIS — J301 Allergic rhinitis due to pollen: Secondary | ICD-10-CM

## 2022-11-19 NOTE — Telephone Encounter (Signed)
Patient called in for refill on Montelukast '10mg'$ . Ph: [facility]

## 2022-11-19 NOTE — Telephone Encounter (Signed)
LVM per DPR that we do not prescribe this medication for her.

## 2023-01-13 ENCOUNTER — Other Ambulatory Visit: Payer: Self-pay | Admitting: Behavioral Health

## 2023-01-13 DIAGNOSIS — F4312 Post-traumatic stress disorder, chronic: Secondary | ICD-10-CM

## 2023-01-13 DIAGNOSIS — F902 Attention-deficit hyperactivity disorder, combined type: Secondary | ICD-10-CM

## 2023-01-13 DIAGNOSIS — F330 Major depressive disorder, recurrent, mild: Secondary | ICD-10-CM

## 2023-01-13 DIAGNOSIS — F411 Generalized anxiety disorder: Secondary | ICD-10-CM

## 2023-03-01 ENCOUNTER — Other Ambulatory Visit: Payer: Self-pay | Admitting: Behavioral Health

## 2023-03-01 DIAGNOSIS — F410 Panic disorder [episodic paroxysmal anxiety] without agoraphobia: Secondary | ICD-10-CM

## 2023-03-01 DIAGNOSIS — F4312 Post-traumatic stress disorder, chronic: Secondary | ICD-10-CM

## 2023-03-02 ENCOUNTER — Encounter: Payer: Self-pay | Admitting: Behavioral Health

## 2023-03-02 ENCOUNTER — Ambulatory Visit (INDEPENDENT_AMBULATORY_CARE_PROVIDER_SITE_OTHER): Payer: 59 | Admitting: Behavioral Health

## 2023-03-02 DIAGNOSIS — F4312 Post-traumatic stress disorder, chronic: Secondary | ICD-10-CM | POA: Diagnosis not present

## 2023-03-02 DIAGNOSIS — F410 Panic disorder [episodic paroxysmal anxiety] without agoraphobia: Secondary | ICD-10-CM

## 2023-03-02 DIAGNOSIS — F411 Generalized anxiety disorder: Secondary | ICD-10-CM

## 2023-03-02 DIAGNOSIS — F902 Attention-deficit hyperactivity disorder, combined type: Secondary | ICD-10-CM | POA: Diagnosis not present

## 2023-03-02 DIAGNOSIS — F330 Major depressive disorder, recurrent, mild: Secondary | ICD-10-CM

## 2023-03-02 MED ORDER — DESVENLAFAXINE SUCCINATE ER 100 MG PO TB24: 100.0000 mg | ORAL_TABLET | Freq: Every day | ORAL | 3 refills | Status: DC

## 2023-03-02 MED ORDER — ALPRAZOLAM 0.5 MG PO TABS: 0.5000 mg | ORAL_TABLET | Freq: Three times a day (TID) | ORAL | 4 refills | Status: DC | PRN

## 2023-03-02 MED ORDER — BUPROPION HCL ER (XL) 300 MG PO TB24: 300.0000 mg | ORAL_TABLET | Freq: Every day | ORAL | 3 refills | Status: DC

## 2023-03-02 MED ORDER — HYDROXYZINE PAMOATE 50 MG PO CAPS: 50.0000 mg | ORAL_CAPSULE | Freq: Every evening | ORAL | 3 refills | Status: DC | PRN

## 2023-03-02 NOTE — Progress Notes (Signed)
[facility]  Patient ID: Ashley Chen,  MRN: [id number]  PCP: Georgianne Fick, MD  Date of Evaluation: 03/02/2023 Time spent:30 minutes  Chief Complaint:  Chief Complaint   Anxiety; Depression; Family Problem; Follow-up; Medication Refill; Patient Education; Trauma; Stress     HISTORY/CURRENT STATUS: HPI 22 year old female presents to this office for follow up and medication management. Says has lots of family stress right now. Found lump in left breast . Examined by OB/GYN and will be following up. Grandfather started chemo. Says she is just overwhelmed right now. Doe snot want to change or adjust her medications today but would like to follow up in 3 months. She has cut back on her vaping. She reports her anxiety today at 5/10 and depression at 3/10.  Is sleeping ok.  Reports no mania, no psychosis. Does not have SI or HI.    Prior Known Psychiatric medication trials: Prozac Zoloft Wellbutrin Mirtazapine (Worked except for excessive weight gain) Klonopin Buspar Prazosin Intuniv Concerta Individual Medical History/ Review of Systems: Changes? :No   Allergies: Horse-derived products, Lactose intolerance (gi), and Other  Current Medications:  Current Outpatient Medications:    albuterol (VENTOLIN HFA) 108 (90 Base) MCG/ACT inhaler, INHALE 1-2 PUFFS INTO THE LUNGS EVERY 6 HOURS AS NEEDED FOR WHEEZING/SHORTNESS OF BREATH, Disp: 6.7 g, Rfl: 0   ALPRAZolam (XANAX) 0.5 MG tablet, Take 1 tablet (0.5 mg total) by mouth 3 (three) times daily as needed., Disp: 90 tablet, Rfl: 4   benzonatate (TESSALON) 100 MG capsule, Take 1 capsule (100 mg total) by mouth 3 (three) times daily as needed for cough., Disp: 21 capsule, Rfl: 0   budesonide-formoterol (SYMBICORT) 160-4.5 MCG/ACT inhaler, Inhale 2 puffs into the lungs in the morning and at bedtime., Disp: 1 each, Rfl: 6   buPROPion (WELLBUTRIN XL) 300 MG 24 hr tablet, Take 1 tablet (300 mg total) by mouth daily., Disp:  30 tablet, Rfl: 3   desvenlafaxine (PRISTIQ) 100 MG 24 hr tablet, Take 1 tablet (100 mg total) by mouth at bedtime., Disp: 90 tablet, Rfl: 3   EPINEPHrine 0.3 mg/0.3 mL IJ SOAJ injection, Inject 0.3 mg into the muscle once as needed (severe allergic reaction)., Disp: 1 each, Rfl: 1   guaiFENesin-codeine (CHERATUSSIN AC) 100-10 MG/5ML syrup, Take 5 mLs by mouth 4 (four) times daily as needed for cough., Disp: 120 mL, Rfl: 0   hydrOXYzine (VISTARIL) 50 MG capsule, Take 1 capsule (50 mg total) by mouth at bedtime as needed for anxiety (insomnia; Panic attack)., Disp: 90 capsule, Rfl: 3   levonorgestrel (MIRENA) 20 MCG/24HR IUD, 1 each by Intrauterine route once. Implanted November 2021, Disp: , Rfl:    montelukast (SINGULAIR) 10 MG tablet, Take 1 tablet (10 mg total) by mouth at bedtime., Disp: 30 tablet, Rfl: 11 Medication Side Effects: none  Family Medical/ Social History: Changes? No  MENTAL HEALTH EXAM:  Blood pressure 133/83, pulse 97, height 5\' 3"  (1.6 m), weight 151 lb (68.5 kg).Body mass index is 26.75 kg/m.  General Appearance: Casual  Eye Contact:  Good  Speech:  Clear and Coherent  Volume:  Normal  Mood:  Anxious, Depressed, and Dysphoric  Affect:  Appropriate  Thought Process:  Coherent  Orientation:  Full (Time, Place, and Person)  Thought Content: Logical   Suicidal Thoughts:  No  Homicidal Thoughts:  No  Memory:  WNL  Judgement:  Good  Insight:  Good  Psychomotor Activity:  Normal  Concentration:  Concentration: Good  Recall:  Good  Fund of Knowledge: Good  Language: Good  Assets:  Desire for Improvement  ADL's:  Intact  Cognition: WNL  Prognosis:  Good    DIAGNOSES:    ICD-10-CM   1. Generalized anxiety disorder  F41.1 buPROPion (WELLBUTRIN XL) 300 MG 24 hr tablet    2. Chronic post-traumatic stress disorder  F43.12 buPROPion (WELLBUTRIN XL) 300 MG 24 hr tablet    ALPRAZolam (XANAX) 0.5 MG tablet    desvenlafaxine (PRISTIQ) 100 MG 24 hr tablet     hydrOXYzine (VISTARIL) 50 MG capsule    3. Attention deficit hyperactivity disorder (ADHD), combined type, moderate  F90.2 buPROPion (WELLBUTRIN XL) 300 MG 24 hr tablet    desvenlafaxine (PRISTIQ) 100 MG 24 hr tablet    4. Mild episode of recurrent major depressive disorder (HCC)  F33.0 buPROPion (WELLBUTRIN XL) 300 MG 24 hr tablet    5. Panic disorder  F41.0 ALPRAZolam (XANAX) 0.5 MG tablet    desvenlafaxine (PRISTIQ) 100 MG 24 hr tablet    hydrOXYzine (VISTARIL) 50 MG capsule      Receiving Psychotherapy: No    RECOMMENDATIONS:    Continue Xanax 0.5 mg 3 times daily prn for panic attacks Continue Mirtazapine 15 mg at bedtime Continue Pristiq 100 mg at bedtime Continue hydroxyzine 50 mg three times daily prn Continue Wellbutrin to 300 mg XL daily To report any severe side effects promptly Provided emergency contact and after hour information To follow up in 3 months for reassessment per pt  Greater than 50%  of 30 min. face to face time with patient was spent on counseling and coordination of care.  Lots of family stress effecting her sense of well being.  We reviewed her medications today.  No changes to medication regimen recommended at this time. Refills escribed to patients pharmacy. Refills provided today PDMP reviewed.             Joan Flores, NP

## 2023-03-08 ENCOUNTER — Other Ambulatory Visit: Payer: Self-pay | Admitting: Obstetrics and Gynecology

## 2023-03-08 DIAGNOSIS — N6320 Unspecified lump in the left breast, unspecified quadrant: Secondary | ICD-10-CM

## 2023-03-24 ENCOUNTER — Ambulatory Visit
Admission: RE | Admit: 2023-03-24 | Discharge: 2023-03-24 | Disposition: A | Discharge status: Home / Self Care | Payer: 59 | Source: Ambulatory Visit | Attending: Obstetrics and Gynecology | Admitting: Obstetrics and Gynecology

## 2023-03-24 DIAGNOSIS — N6320 Unspecified lump in the left breast, unspecified quadrant: Secondary | ICD-10-CM

## 2023-06-02 ENCOUNTER — Ambulatory Visit (INDEPENDENT_AMBULATORY_CARE_PROVIDER_SITE_OTHER): Payer: 59 | Admitting: Behavioral Health

## 2023-06-02 ENCOUNTER — Encounter: Payer: Self-pay | Admitting: Behavioral Health

## 2023-06-02 VITALS — Wt 140.0 lb

## 2023-06-02 DIAGNOSIS — F3341 Major depressive disorder, recurrent, in partial remission: Secondary | ICD-10-CM

## 2023-06-02 DIAGNOSIS — F902 Attention-deficit hyperactivity disorder, combined type: Secondary | ICD-10-CM

## 2023-06-02 DIAGNOSIS — F411 Generalized anxiety disorder: Secondary | ICD-10-CM | POA: Diagnosis not present

## 2023-06-02 NOTE — Progress Notes (Signed)
[facility]  Patient ID: Ashley Chen,  MRN: [id number]  PCP: Georgianne Fick, MD  Date of Evaluation: 06/02/2023 Time spent:30 minutes  Chief Complaint:  Chief Complaint   Anxiety; Depression; Follow-up; Medication Refill; Patient Education     HISTORY/CURRENT STATUS: HPI  22 year old female presents to this office for follow up and medication management.  She is smiling and says she is doing very well recently. She just got engaged in June. No wedding date set yet. Family is doing better. Doe snot want to change or adjust her medications today but would like to follow up in 3 months. She has cut back on her vaping. Lost 11lbs since last visit.  She reports her anxiety today at 2/10 and depression at 2/10.  Is sleeping ok.  Reports no mania, no psychosis. Does not have SI or HI.    Prior Known Psychiatric medication trials: Prozac Zoloft Wellbutrin Mirtazapine (Worked except for excessive weight gain) Klonopin Buspar Prazosin Intuniv Concerta    Individual Medical History/ Review of Systems: Changes? :No   Allergies: Horse-derived products, Lactose intolerance (gi), and Other  Current Medications:  Current Outpatient Medications:    albuterol (VENTOLIN HFA) 108 (90 Base) MCG/ACT inhaler, INHALE 1-2 PUFFS INTO THE LUNGS EVERY 6 HOURS AS NEEDED FOR WHEEZING/SHORTNESS OF BREATH, Disp: 6.7 g, Rfl: 0   ALPRAZolam (XANAX) 0.5 MG tablet, Take 1 tablet (0.5 mg total) by mouth 3 (three) times daily as needed., Disp: 90 tablet, Rfl: 4   benzonatate (TESSALON) 100 MG capsule, Take 1 capsule (100 mg total) by mouth 3 (three) times daily as needed for cough., Disp: 21 capsule, Rfl: 0   budesonide-formoterol (SYMBICORT) 160-4.5 MCG/ACT inhaler, Inhale 2 puffs into the lungs in the morning and at bedtime., Disp: 1 each, Rfl: 6   buPROPion (WELLBUTRIN XL) 300 MG 24 hr tablet, Take 1 tablet (300 mg total) by mouth daily., Disp: 30 tablet, Rfl: 3   desvenlafaxine  (PRISTIQ) 100 MG 24 hr tablet, Take 1 tablet (100 mg total) by mouth at bedtime., Disp: 90 tablet, Rfl: 3   EPINEPHrine 0.3 mg/0.3 mL IJ SOAJ injection, Inject 0.3 mg into the muscle once as needed (severe allergic reaction)., Disp: 1 each, Rfl: 1   hydrOXYzine (VISTARIL) 50 MG capsule, Take 1 capsule (50 mg total) by mouth at bedtime as needed for anxiety (insomnia; Panic attack)., Disp: 90 capsule, Rfl: 3   levonorgestrel (MIRENA) 20 MCG/24HR IUD, 1 each by Intrauterine route once. Implanted November 2021, Disp: , Rfl:    montelukast (SINGULAIR) 10 MG tablet, Take 1 tablet (10 mg total) by mouth at bedtime., Disp: 30 tablet, Rfl: 11 Medication Side Effects: none  Family Medical/ Social History: Changes? No  MENTAL HEALTH EXAM:  Weight 140 lb (63.5 kg).Body mass index is 24.8 kg/m.  General Appearance: Casual, Neat, and Well Groomed  Eye Contact:  Good  Speech:  Clear and Coherent  Volume:  Normal  Mood:  NA  Affect:  Appropriate  Thought Process:  Coherent  Orientation:  Full (Time, Place, and Person)  Thought Content: Logical   Suicidal Thoughts:  No  Homicidal Thoughts:  No  Memory:  WNL  Judgement:  Good  Insight:  Good  Psychomotor Activity:  Normal  Concentration:  Concentration: Good  Recall:  Good  Fund of Knowledge: Good  Language: Good  Assets:  Desire for Improvement  ADL's:  Intact  Cognition: WNL  Prognosis:  Good    DIAGNOSES:    ICD-10-CM  1. Generalized anxiety disorder  F41.1     2. Attention deficit hyperactivity disorder (ADHD), combined type, moderate  F90.2     3. Recurrent major depressive disorder, in partial remission (HCC)  F33.41       Receiving Psychotherapy: No    RECOMMENDATIONS:   Continue Xanax 0.5 mg 3 times daily prn for panic attacks Continue Mirtazapine 15 mg at bedtime Continue Pristiq 100 mg at bedtime Continue hydroxyzine 50 mg three times daily prn Continue Wellbutrin to 300 mg XL daily To report any severe side  effects promptly Provided emergency contact and after hour information To follow up in 3 months for reassessment per pt   Greater than 50%  of 30 min. face to face time with patient was spent on counseling and coordination of care.  She is happy and smiling today. Happy with medications.  No changes to medication regimen recommended at this time. Refills escribed to patients pharmacy. Refills provided today PDMP reviewed.     Joan Flores, NP

## 2023-07-19 ENCOUNTER — Other Ambulatory Visit: Payer: Self-pay | Admitting: Behavioral Health

## 2023-07-19 DIAGNOSIS — F902 Attention-deficit hyperactivity disorder, combined type: Secondary | ICD-10-CM

## 2023-07-19 DIAGNOSIS — F411 Generalized anxiety disorder: Secondary | ICD-10-CM

## 2023-07-19 DIAGNOSIS — F330 Major depressive disorder, recurrent, mild: Secondary | ICD-10-CM

## 2023-07-19 DIAGNOSIS — F4312 Post-traumatic stress disorder, chronic: Secondary | ICD-10-CM

## 2023-07-29 ENCOUNTER — Other Ambulatory Visit: Payer: Self-pay | Admitting: Medical Genetics

## 2023-07-29 DIAGNOSIS — Z006 Encounter for examination for normal comparison and control in clinical research program: Secondary | ICD-10-CM

## 2023-08-02 ENCOUNTER — Other Ambulatory Visit: Payer: Self-pay | Admitting: Behavioral Health

## 2023-08-02 DIAGNOSIS — F4312 Post-traumatic stress disorder, chronic: Secondary | ICD-10-CM

## 2023-08-02 DIAGNOSIS — F410 Panic disorder [episodic paroxysmal anxiety] without agoraphobia: Secondary | ICD-10-CM

## 2023-08-02 NOTE — Telephone Encounter (Signed)
LF 10/7; LV 09/5; NV 01/8. I changed rf amount to 2.

## 2023-08-10 ENCOUNTER — Encounter: Payer: Self-pay | Admitting: Psychiatry

## 2023-08-30 ENCOUNTER — Other Ambulatory Visit: Payer: Self-pay | Admitting: Obstetrics and Gynecology

## 2023-09-23 ENCOUNTER — Encounter (HOSPITAL_BASED_OUTPATIENT_CLINIC_OR_DEPARTMENT_OTHER): Payer: Self-pay | Admitting: Obstetrics and Gynecology

## 2023-09-23 NOTE — Progress Notes (Signed)
Spoke w/ via phone for pre-op interview--- [name] [facility] needs dos---- UPT, T&S and CBC per surgeon        Lab results------ COVID test -----patient states asymptomatic no test needed Arrive at -------0845 NPO after MN NO Solid Food.  Clear liquids from MN until---0745 Med rec completed Medications to take morning of surgery -----Xanax-PRN, Albuterol-bring, Symbicort. Diabetic medication ----- Patient instructed no nail polish to be worn day of surgery Patient instructed to bring photo id and insurance card day of surgery Patient aware to have Driver (ride ) / caregiver    for 24 hours after surgery - Fiance Ashley Chen Patient Special Instructions ----- Pre-Op special Instructions ----- Patient verbalized understanding of instructions that were given at this phone interview. Patient denies chest pain, sob, fever, cough at the interview.

## 2023-10-03 ENCOUNTER — Other Ambulatory Visit: Payer: Self-pay

## 2023-10-03 ENCOUNTER — Encounter (HOSPITAL_BASED_OUTPATIENT_CLINIC_OR_DEPARTMENT_OTHER)
Admission: RE | Disposition: A | Discharge status: Home / Self Care | Payer: Self-pay | Source: Home / Self Care | Attending: Obstetrics and Gynecology

## 2023-10-03 ENCOUNTER — Ambulatory Visit (HOSPITAL_BASED_OUTPATIENT_CLINIC_OR_DEPARTMENT_OTHER): Payer: 59 | Admitting: Certified Registered Nurse Anesthetist

## 2023-10-03 ENCOUNTER — Encounter (HOSPITAL_BASED_OUTPATIENT_CLINIC_OR_DEPARTMENT_OTHER): Payer: Self-pay | Admitting: Obstetrics and Gynecology

## 2023-10-03 ENCOUNTER — Ambulatory Visit (HOSPITAL_BASED_OUTPATIENT_CLINIC_OR_DEPARTMENT_OTHER)
Admission: RE | Admit: 2023-10-03 | Discharge: 2023-10-03 | Disposition: A | Discharge status: Home / Self Care | Payer: 59 | Attending: Obstetrics and Gynecology | Admitting: Obstetrics and Gynecology

## 2023-10-03 DIAGNOSIS — J45909 Unspecified asthma, uncomplicated: Secondary | ICD-10-CM | POA: Diagnosis not present

## 2023-10-03 DIAGNOSIS — F17290 Nicotine dependence, other tobacco product, uncomplicated: Secondary | ICD-10-CM | POA: Insufficient documentation

## 2023-10-03 DIAGNOSIS — N80329 Endometriosis of the posterior cul-de-sac, unspecified depth: Secondary | ICD-10-CM | POA: Diagnosis not present

## 2023-10-03 DIAGNOSIS — N809 Endometriosis, unspecified: Secondary | ICD-10-CM | POA: Diagnosis not present

## 2023-10-03 HISTORY — DX: Nausea with vomiting, unspecified: R11.2

## 2023-10-03 HISTORY — DX: Other specified postprocedural states: Z98.890

## 2023-10-03 HISTORY — PX: LAPAROSCOPY: SHX197

## 2023-10-03 LAB — CBC
HCT: 41.6 % (ref 36.0–46.0)
Hemoglobin: 13.7 g/dL (ref 12.0–15.0)
MCH: 31.2 pg (ref 26.0–34.0)
MCHC: 32.9 g/dL (ref 30.0–36.0)
MCV: 94.8 fL (ref 80.0–100.0)
Platelets: 320 10*3/uL (ref 150–400)
RBC: 4.39 MIL/uL (ref 3.87–5.11)
RDW: 13.1 % (ref 11.5–15.5)
WBC: 7.4 10*3/uL (ref 4.0–10.5)
nRBC: 0 % (ref 0.0–0.2)

## 2023-10-03 LAB — ABO/RH: ABO/RH(D): O POS

## 2023-10-03 LAB — TYPE AND SCREEN
ABO/RH(D): O POS
Antibody Screen: NEGATIVE

## 2023-10-03 LAB — POCT PREGNANCY, URINE: Preg Test, Ur: NEGATIVE

## 2023-10-03 SURGERY — LAPAROSCOPY, DIAGNOSTIC
Anesthesia: General

## 2023-10-03 MED ORDER — ONDANSETRON 4 MG PO TBDP
4.0000 mg | ORAL_TABLET | Freq: Once | ORAL | Status: AC
  Administered 2023-10-03: 4 mg via ORAL

## 2023-10-03 MED ORDER — FENTANYL CITRATE (PF) 100 MCG/2ML IJ SOLN
INTRAMUSCULAR | Status: AC
  Filled 2023-10-03: qty 2

## 2023-10-03 MED ORDER — OXYCODONE HCL 5 MG PO TABS
5.0000 mg | ORAL_TABLET | Freq: Once | ORAL | Status: AC | PRN
Start: 2023-10-03 — End: 2023-10-03
  Administered 2023-10-03: 5 mg via ORAL

## 2023-10-03 MED ORDER — STERILE WATER FOR IRRIGATION IR SOLN
Status: DC | PRN
  Administered 2023-10-03: 500 mL

## 2023-10-03 MED ORDER — KETOROLAC TROMETHAMINE 30 MG/ML IJ SOLN
INTRAMUSCULAR | Status: DC | PRN
  Administered 2023-10-03: 30 mg via INTRAVENOUS

## 2023-10-03 MED ORDER — KETOROLAC TROMETHAMINE 30 MG/ML IJ SOLN
INTRAMUSCULAR | Status: AC
  Filled 2023-10-03: qty 1

## 2023-10-03 MED ORDER — ONDANSETRON HCL 4 MG/2ML IJ SOLN
INTRAMUSCULAR | Status: DC | PRN
  Administered 2023-10-03 (×2): 4 mg via INTRAVENOUS

## 2023-10-03 MED ORDER — SODIUM CHLORIDE 0.9 % IV SOLN
INTRAVENOUS | Status: DC
  Administered 2023-10-03: 500 mL via INTRAVENOUS

## 2023-10-03 MED ORDER — DEXAMETHASONE SODIUM PHOSPHATE 10 MG/ML IJ SOLN
INTRAMUSCULAR | Status: AC
  Filled 2023-10-03: qty 1

## 2023-10-03 MED ORDER — FENTANYL CITRATE (PF) 100 MCG/2ML IJ SOLN
INTRAMUSCULAR | Status: DC | PRN
  Administered 2023-10-03 (×4): 50 ug via INTRAVENOUS

## 2023-10-03 MED ORDER — ROCURONIUM BROMIDE 10 MG/ML (PF) SYRINGE
PREFILLED_SYRINGE | INTRAVENOUS | Status: AC
  Filled 2023-10-03: qty 10

## 2023-10-03 MED ORDER — IBUPROFEN 600 MG PO TABS: 600.0000 mg | ORAL_TABLET | Freq: Four times a day (QID) | ORAL | 1 refills | Status: DC | PRN

## 2023-10-03 MED ORDER — CEFAZOLIN SODIUM 1 G IJ SOLR
INTRAMUSCULAR | Status: AC
  Filled 2023-10-03: qty 10

## 2023-10-03 MED ORDER — MIDAZOLAM HCL 2 MG/2ML IJ SOLN
INTRAMUSCULAR | Status: AC
  Filled 2023-10-03: qty 2

## 2023-10-03 MED ORDER — PROPOFOL 10 MG/ML IV BOLUS
INTRAVENOUS | Status: AC
  Filled 2023-10-03: qty 20

## 2023-10-03 MED ORDER — SUGAMMADEX SODIUM 200 MG/2ML IV SOLN
INTRAVENOUS | Status: DC | PRN
  Administered 2023-10-03: 200 mg via INTRAVENOUS

## 2023-10-03 MED ORDER — ROCURONIUM BROMIDE 10 MG/ML (PF) SYRINGE
PREFILLED_SYRINGE | INTRAVENOUS | Status: DC | PRN
  Administered 2023-10-03: 10 mg via INTRAVENOUS
  Administered 2023-10-03: 50 mg via INTRAVENOUS

## 2023-10-03 MED ORDER — OXYCODONE HCL 5 MG PO TABS
5.0000 mg | ORAL_TABLET | Freq: Four times a day (QID) | ORAL | 0 refills | Status: DC | PRN
Start: 2023-10-03 — End: 2024-02-17

## 2023-10-03 MED ORDER — ONDANSETRON 4 MG PO TBDP
ORAL_TABLET | ORAL | Status: AC
  Filled 2023-10-03: qty 1

## 2023-10-03 MED ORDER — LIDOCAINE HCL (PF) 2 % IJ SOLN
INTRAMUSCULAR | Status: AC
  Filled 2023-10-03: qty 5

## 2023-10-03 MED ORDER — DEXAMETHASONE SODIUM PHOSPHATE 10 MG/ML IJ SOLN
INTRAMUSCULAR | Status: DC | PRN
  Administered 2023-10-03: 5 mg via INTRAVENOUS

## 2023-10-03 MED ORDER — BUPIVACAINE HCL (PF) 0.25 % IJ SOLN
INTRAMUSCULAR | Status: DC | PRN
  Administered 2023-10-03: 20 mL

## 2023-10-03 MED ORDER — CEFAZOLIN SODIUM-DEXTROSE 2-3 GM-%(50ML) IV SOLR
INTRAVENOUS | Status: DC | PRN
  Administered 2023-10-03: 2 g via INTRAVENOUS

## 2023-10-03 MED ORDER — OXYCODONE HCL 5 MG/5ML PO SOLN: 5.0000 mg | Freq: Once | ORAL | Status: AC | PRN

## 2023-10-03 MED ORDER — MIDAZOLAM HCL 5 MG/5ML IJ SOLN
INTRAMUSCULAR | Status: DC | PRN
  Administered 2023-10-03: 2 mg via INTRAVENOUS

## 2023-10-03 MED ORDER — ONDANSETRON HCL 4 MG/2ML IJ SOLN
INTRAMUSCULAR | Status: AC
  Filled 2023-10-03: qty 2

## 2023-10-03 MED ORDER — OXYCODONE HCL 5 MG PO TABS
ORAL_TABLET | ORAL | Status: AC
  Filled 2023-10-03: qty 1

## 2023-10-03 MED ORDER — LIDOCAINE 2% (20 MG/ML) 5 ML SYRINGE
INTRAMUSCULAR | Status: DC | PRN
  Administered 2023-10-03: 60 mg via INTRAVENOUS

## 2023-10-03 MED ORDER — DEXMEDETOMIDINE HCL IN NACL 80 MCG/20ML IV SOLN
INTRAVENOUS | Status: DC | PRN
  Administered 2023-10-03 (×2): 8 ug via INTRAVENOUS

## 2023-10-03 MED ORDER — ONDANSETRON HCL 4 MG/2ML IJ SOLN: 4.0000 mg | Freq: Four times a day (QID) | INTRAMUSCULAR | Status: DC | PRN

## 2023-10-03 MED ORDER — PROPOFOL 10 MG/ML IV BOLUS
INTRAVENOUS | Status: DC | PRN
  Administered 2023-10-03: 200 mg via INTRAVENOUS

## 2023-10-03 MED ORDER — FENTANYL CITRATE (PF) 100 MCG/2ML IJ SOLN
25.0000 ug | INTRAMUSCULAR | Status: DC | PRN
  Administered 2023-10-03: 50 ug via INTRAVENOUS

## 2023-10-03 SURGICAL SUPPLY — 46 items
APPLICATOR ARISTA FLEXITIP XL (MISCELLANEOUS) IMPLANT
CABLE HIGH FREQUENCY MONO STRZ (ELECTRODE) IMPLANT
CHLORAPREP W/TINT 26 (MISCELLANEOUS) IMPLANT
COVER MAYO STAND STRL (DRAPES) ×1 IMPLANT
COVER SURGICAL LIGHT HANDLE (MISCELLANEOUS) IMPLANT
DERMABOND ADVANCED .7 DNX12 (GAUZE/BANDAGES/DRESSINGS) ×1 IMPLANT
DILATOR CANAL MILEX (MISCELLANEOUS) IMPLANT
DRAPE SURG IRRIG POUCH 19X23 (DRAPES) ×1 IMPLANT
DRSG OPSITE POSTOP 3X4 (GAUZE/BANDAGES/DRESSINGS) ×1 IMPLANT
DRSG TELFA 3X8 NADH STRL (GAUZE/BANDAGES/DRESSINGS) IMPLANT
DURAPREP 26ML APPLICATOR (WOUND CARE) ×1 IMPLANT
GAUZE 4X4 16PLY ~~LOC~~+RFID DBL (SPONGE) ×1 IMPLANT
GLOVE BIO SURGEON STRL SZ7.5 (GLOVE) ×1 IMPLANT
GLOVE BIOGEL PI IND STRL 7.0 (GLOVE) ×2 IMPLANT
GLOVE BIOGEL PI IND STRL 7.5 (GLOVE) ×2 IMPLANT
GOWN STRL REUS W/TWL LRG LVL3 (GOWN DISPOSABLE) ×1 IMPLANT
HEMOSTAT ARISTA ABSORB 3G PWDR (HEMOSTASIS) IMPLANT
IRRIG SUCT STRYKERFLOW 2 WTIP (MISCELLANEOUS) ×1
IRRIGATION SUCT STRKRFLW 2 WTP (MISCELLANEOUS) ×1 IMPLANT
KIT PINK PAD W/HEAD ARE REST (MISCELLANEOUS) ×1
KIT PINK PAD W/HEAD ARM REST (MISCELLANEOUS) ×1 IMPLANT
KIT TURNOVER CYSTO (KITS) ×1 IMPLANT
LIGASURE VESSEL 5MM BLUNT TIP (ELECTROSURGICAL) ×1 IMPLANT
NDL INSUFFLATION 14GA 120MM (NEEDLE) ×1 IMPLANT
NEEDLE INSUFFLATION 14GA 120MM (NEEDLE) ×1
NS IRRIG 1000ML POUR BTL (IV SOLUTION) IMPLANT
PACK LAPAROSCOPY BASIN (CUSTOM PROCEDURE TRAY) ×1 IMPLANT
SCRUB CHG 4% DYNA-HEX 4OZ (MISCELLANEOUS) ×1 IMPLANT
SET TUBE SMOKE EVAC HIGH FLOW (TUBING) ×1 IMPLANT
SLEEVE SCD COMPRESS KNEE MED (STOCKING) ×1 IMPLANT
SLEEVE Z-THREAD 5X100MM (TROCAR) IMPLANT
SUT MNCRL AB 3-0 PS2 27 (SUTURE) ×1 IMPLANT
SUT MNCRL AB 4-0 PS2 18 (SUTURE) IMPLANT
SUT VIC AB 4-0 PS2 18 (SUTURE) IMPLANT
SUT VICRYL 0 ENDOLOOP (SUTURE) IMPLANT
SUT VICRYL 0 TIES 12 18 (SUTURE) IMPLANT
SUT VICRYL 0 UR6 27IN ABS (SUTURE) ×1 IMPLANT
SYR 50ML LL SCALE MARK (SYRINGE) ×1 IMPLANT
SYS BAG RETRIEVAL 10MM (BASKET)
SYSTEM BAG RETRIEVAL 10MM (BASKET) IMPLANT
TOWEL OR 17X24 6PK STRL BLUE (TOWEL DISPOSABLE) ×1 IMPLANT
TRAY FOLEY W/BAG SLVR 14FR LF (SET/KITS/TRAYS/PACK) ×1 IMPLANT
TROCAR BALLN 12MMX100 BLUNT (TROCAR) IMPLANT
TROCAR Z-THREAD FIOS 11X100 BL (TROCAR) ×1 IMPLANT
TROCAR Z-THREAD FIOS 5X100MM (TROCAR) ×1 IMPLANT
WARMER LAPAROSCOPE (MISCELLANEOUS) ×1 IMPLANT

## 2023-10-03 NOTE — Discharge Instructions (Signed)

## 2023-10-03 NOTE — Op Note (Signed)
 Preop Diagnosis: Endometriosis   Postop Diagnosis: Endometriosis   Procedure: LAPAROSCOPY DIAGNOSTIC   Anesthesia: General   Anesthesiologist: No responsible provider has been recorded for the case.   Attending: Henry Slough, MD   Assistant:  Judyann  Findings: Right posterior cul-de-sac implant with the appearance of endometriosis.  A window in the midline of cul-de-sac and one peritoneal powder burn appearing implant over the left internal iliac.  Pathology: 2 specimens from area in right posterior cul-de-sac  Fluids: 500 cc  UOP: 75 cc  EBL: 10 cc  Complications: None  Procedure:  The patient was taken to the operating room after the risks, benefits, alternatives, complications, treatment options, and expected outcomes were discussed with the patient. The patient verbalized understanding, the patient concurred with the proposed plan and consent signed and witnessed. The patient was taken to the Operating Room and identified as Ashley Chen and the procedure verified as Dx Laparoscopy.  The patient was placed under general anesthesia per anesthesia staff, the patient was placed in modified dorsal lithotomy position and was prepped, draped, and catheterized in the normal, sterile fashion.  A Time Out was held and the above information confirmed.  The cervix was visualized with IUD in place and an intrauterine manipulator was placed.  A 10 mm umbilical incision was then performed. Dissection down to the fascia showed no mesh.  A veress needle was passed and pneumoperitoneum was established. A 10 mm trocar was advanced into the intraabdominal cavity, the laparoscope was introduced and findings as noted above.  Patient was placed in trendelenburg and marcaine  injected in the LLQ and a 5 mm incision was made and 5 mm trocar advanced into the intraabdominal cavity.  The same was done in the RLQ.  Findings as noted above.  Peritoneum was injected with 2-3cc of NS posterior  to the right cul-de-sac implant.  The implant was excised with 2 pieces and specimens sent to pathology.  Slight oozing was controlled with the monopolar scissors.  Good hemostasis was noted even after taking relieving pneumo pressure.  The right and left trocars were removed under direct visualization.  Pneumoperitoneum was relieved and umbilical trocar removed under direct visualization.  The umbilical fascia was repaired with 0 vicryl.  All skin incisions were repaired with 3-0 monocryl via a subcuticular stitch and dermabond was applied to all incisions.  A coverlet was placed over umbilical incision.  Sponge, instrument, lap and needle counts were correct.  The patient tolerated the procedure well and was awaiting extubation and transfer to the recovery room in good condition.

## 2023-10-03 NOTE — Anesthesia Procedure Notes (Signed)
 Procedure Name: Intubation Date/Time: 10/03/2023 11:18 AM  Performed by: Franchot Delon RAMAN, CRNAPre-anesthesia Checklist: Patient identified, Emergency Drugs available, Suction available and Patient being monitored Patient Re-evaluated:Patient Re-evaluated prior to induction Oxygen Delivery Method: Circle System Utilized Preoxygenation: Pre-oxygenation with 100% oxygen Induction Type: IV induction Ventilation: Mask ventilation without difficulty Laryngoscope Size: Mac and 3 Grade View: Grade I Tube type: Oral Tube size: 7.0 mm Number of attempts: 1 Airway Equipment and Method: Stylet Placement Confirmation: ETT inserted through vocal cords under direct vision, positive ETCO2 and breath sounds checked- equal and bilateral Secured at: 22 cm Tube secured with: Tape Dental Injury: Teeth and Oropharynx as per pre-operative assessment

## 2023-10-03 NOTE — Transfer of Care (Signed)
 Immediate Anesthesia Transfer of Care Note  Patient: Ashley Chen  Procedure(s) Performed: LAPAROSCOPY DIAGNOSTIC excision endometiotic implant  Patient Location: PACU  Anesthesia Type:General  Level of Consciousness: awake, alert , oriented, and patient cooperative  Airway & Oxygen Therapy: Patient Spontanous Breathing and Patient connected to nasal cannula oxygen  Post-op Assessment: Report given to RN and Post -op Vital signs reviewed and stable  Post vital signs: Reviewed and stable  Last Vitals:  Vitals Value Taken Time  BP    Temp 36.2 C 10/03/23 1335  Pulse 97 10/03/23 1337  Resp 15 10/03/23 1337  SpO2 95 % 10/03/23 1337  Vitals shown include unfiled device data.  Last Pain:  Vitals:   10/03/23 0936  TempSrc: Oral  PainSc: 0-No pain      Patients Stated Pain Goal: 5 (10/03/23 0936)  Complications: No notable events documented.

## 2023-10-03 NOTE — H&P (Signed)
 [address] Ashley Chen is an 23 y.o. female. Patient presents for scheduled dx laparoscopy and   Pertinent Gynecological History:  OB History: G0, P0   Menstrual History:  No LMP recorded. (Menstrual status: IUD).    Past Medical History:  Diagnosis Date   Anxiety    Asthma    Depression    Migraine    PONV (postoperative nausea and vomiting)    Seasonal allergies     Past Surgical History:  Procedure Laterality Date   FOOT SURGERY     labial surgery     TONSILECTOMY, ADENOIDECTOMY, BILATERAL MYRINGOTOMY AND TUBES     before age 57   TONSILLECTOMY AND ADENOIDECTOMY     umblical hernia repair     before age 29    Family History  Problem Relation Age of Onset   Drug abuse Mother    Anxiety disorder Mother    ADD / ADHD Brother    Mental retardation Brother    Depression Paternal Uncle    Alcohol abuse Paternal Uncle     Social History:  reports that she has been smoking e-cigarettes. She has never used smokeless tobacco. She reports current alcohol use of about 3.0 standard drinks of alcohol per week. She reports current drug use. Drug: Marijuana.  Allergies:  Allergies  Allergen Reactions   Horse-Derived Products Anaphylaxis    Reaction to contact with horses   Azithromycin Hives    And rapid heart rate   Lactose Intolerance (Gi) Other (See Comments)    constipation   Other Other (See Comments)    Allergy to cats and some dogs - water  eyes - stuffy nose    Medications Prior to Admission  Medication Sig Dispense Refill Last Dose/Taking   albuterol  (VENTOLIN  HFA) 108 (90 Base) MCG/ACT inhaler INHALE 1-2 PUFFS INTO THE LUNGS EVERY 6 HOURS AS NEEDED FOR WHEEZING/SHORTNESS OF BREATH 6.7 g 0 Past Week   ALPRAZolam  (XANAX ) 0.5 MG tablet TAKE 1 TABLET BY MOUTH 3 TIMES DAILY AS NEEDED. 90 tablet 2 10/03/2023 Morning   budesonide -formoterol  (SYMBICORT ) 160-4.5 MCG/ACT inhaler Inhale 2 puffs into the lungs in the morning and at bedtime. 1 each 6 Past Week   buPROPion   (WELLBUTRIN  XL) 300 MG 24 hr tablet TAKE 1 TABLET (300 MG TOTAL) BY MOUTH DAILY. 30 tablet 3 10/02/2023   desvenlafaxine  (PRISTIQ ) 100 MG 24 hr tablet Take 1 tablet (100 mg total) by mouth at bedtime. 90 tablet 3 10/02/2023   hydrOXYzine  (VISTARIL ) 50 MG capsule Take 1 capsule (50 mg total) by mouth at bedtime as needed for anxiety (insomnia; Panic attack). 90 capsule 3 10/02/2023   montelukast  (SINGULAIR ) 10 MG tablet Take 1 tablet (10 mg total) by mouth at bedtime. 30 tablet 11 10/02/2023   OVER THE COUNTER MEDICATION Take 1 tablet by mouth daily. Pre natal vitamin   10/02/2023   benzonatate  (TESSALON ) 100 MG capsule Take 1 capsule (100 mg total) by mouth 3 (three) times daily as needed for cough. 21 capsule 0    EPINEPHrine  0.3 mg/0.3 mL IJ SOAJ injection Inject 0.3 mg into the muscle once as needed (severe allergic reaction). 1 each 1 More than a month   levonorgestrel  (MIRENA ) 20 MCG/24HR IUD 1 each by Intrauterine route once. Implanted November 2021       Review of Systems Denies F/C/N/V/D  Blood pressure 118/82, pulse 95, temperature 97.9 F (36.6 C), temperature source Oral, resp. rate 17, height 5' 3 (1.6 m), weight 58 kg, SpO2 98%. Physical Exam  Lungs  unlabored breathing CV RRR Abdomen soft, nt Extremities no calf tenderness  Results for orders placed or performed during the hospital encounter of 10/03/23 (from the past 24 hours)  Pregnancy, urine POC     Status: None   Collection Time: 10/03/23  8:57 AM  Result Value Ref Range   Preg Test, Ur NEGATIVE NEGATIVE  CBC     Status: None   Collection Time: 10/03/23  9:23 AM  Result Value Ref Range   WBC 7.4 4.0 - 10.5 K/uL   RBC 4.39 3.87 - 5.11 MIL/uL   Hemoglobin 13.7 12.0 - 15.0 g/dL   HCT 58.3 63.9 - 53.9 %   MCV 94.8 80.0 - 100.0 fL   MCH 31.2 26.0 - 34.0 pg   MCHC 32.9 30.0 - 36.0 g/dL   RDW 86.8 88.4 - 84.4 %   Platelets 320 150 - 400 K/uL   nRBC 0.0 0.0 - 0.2 %  Type and screen [facility]     Status: None  (Preliminary result)   Collection Time: 10/03/23  9:23 AM  Result Value Ref Range   ABO/RH(D) PENDING    Antibody Screen PENDING    Sample Expiration      10/06/2023,2359 Performed at [facility], [address] [place], [place] [address]     No results found.  Assessment/Plan: G0 presenting for dx laparoscopy d/t CPP with suspicion for endometriosis.  Pt has mirena  iud in place and although got significant relief from orilissa and myfembree, the SEs were intolerable.  Pt self d/c'd both meds.  The risks, benefits and alternatives were discussed with the patient and consent signed and witnessed.  Pt does not think she has mesh at the umbilical hernia repair site.  She had the surgery before 23yo.  Records were unable to be obtained.  Questions answered.  Jon CINDERELLA Rummer 10/03/2023, 10:45 AM

## 2023-10-03 NOTE — Anesthesia Preprocedure Evaluation (Signed)
 Anesthesia Evaluation  Patient identified by MRN, date of birth, ID band Patient awake    Reviewed: Allergy & Precautions, H&P , NPO status , Patient's Chart, lab work & pertinent test results  History of Anesthesia Complications (+) PONV and history of anesthetic complications  Airway Mallampati: II   Neck ROM: full    Dental   Pulmonary asthma , Current Smoker   breath sounds clear to auscultation       Cardiovascular negative cardio ROS  Rhythm:regular Rate:Normal     Neuro/Psych  Headaches PSYCHIATRIC DISORDERS Anxiety Depression       GI/Hepatic   Endo/Other    Renal/GU      Musculoskeletal   Abdominal   Peds  Hematology   Anesthesia Other Findings   Reproductive/Obstetrics                             Anesthesia Physical Anesthesia Plan  ASA: 2  Anesthesia Plan: General   Post-op Pain Management:    Induction: Intravenous  PONV Risk Score and Plan: 3 and Ondansetron , Dexamethasone , Midazolam  and Treatment may vary due to age or medical condition  Airway Management Planned: Oral ETT  Additional Equipment:   Intra-op Plan:   Post-operative Plan: Extubation in OR  Informed Consent: I have reviewed the patients History and Physical, chart, labs and discussed the procedure including the risks, benefits and alternatives for the proposed anesthesia with the patient or authorized representative who has indicated his/her understanding and acceptance.     Dental advisory given  Plan Discussed with: CRNA, Anesthesiologist and Surgeon  Anesthesia Plan Comments:        Anesthesia Quick Evaluation

## 2023-10-04 ENCOUNTER — Encounter (HOSPITAL_BASED_OUTPATIENT_CLINIC_OR_DEPARTMENT_OTHER): Payer: Self-pay | Admitting: Obstetrics and Gynecology

## 2023-10-05 ENCOUNTER — Ambulatory Visit: Payer: 59 | Admitting: Behavioral Health

## 2023-10-05 LAB — SURGICAL PATHOLOGY

## 2023-10-07 NOTE — Anesthesia Postprocedure Evaluation (Signed)
 Anesthesia Post Note  Patient: Ashley Chen  Procedure(s) Performed: LAPAROSCOPY DIAGNOSTIC excision endometiotic implant     Patient location during evaluation: PACU Anesthesia Type: General Level of consciousness: awake and alert Pain management: pain level controlled Vital Signs Assessment: post-procedure vital signs reviewed and stable Respiratory status: spontaneous breathing, nonlabored ventilation, respiratory function stable and patient connected to nasal cannula oxygen Cardiovascular status: blood pressure returned to baseline and stable Postop Assessment: no apparent nausea or vomiting Anesthetic complications: no   No notable events documented.  Last Vitals:  Vitals:   10/03/23 1411 10/03/23 1502  BP:  117/68  Pulse: (!) 109 94  Resp: 19 16  Temp: (!) 36.3 C 36.6 C  SpO2: 99% 100%    Last Pain:  Vitals:   10/03/23 1502  TempSrc: Oral  PainSc: 3                  Keshonna Valvo S

## 2023-10-24 ENCOUNTER — Encounter (HOSPITAL_BASED_OUTPATIENT_CLINIC_OR_DEPARTMENT_OTHER): Payer: Self-pay | Admitting: Obstetrics and Gynecology

## 2023-10-27 ENCOUNTER — Encounter: Payer: Self-pay | Admitting: Behavioral Health

## 2023-10-27 ENCOUNTER — Ambulatory Visit (INDEPENDENT_AMBULATORY_CARE_PROVIDER_SITE_OTHER): Payer: 59 | Admitting: Behavioral Health

## 2023-10-27 DIAGNOSIS — F410 Panic disorder [episodic paroxysmal anxiety] without agoraphobia: Secondary | ICD-10-CM | POA: Diagnosis not present

## 2023-10-27 DIAGNOSIS — F411 Generalized anxiety disorder: Secondary | ICD-10-CM | POA: Diagnosis not present

## 2023-10-27 DIAGNOSIS — F902 Attention-deficit hyperactivity disorder, combined type: Secondary | ICD-10-CM

## 2023-10-27 DIAGNOSIS — F4312 Post-traumatic stress disorder, chronic: Secondary | ICD-10-CM

## 2023-10-27 DIAGNOSIS — F330 Major depressive disorder, recurrent, mild: Secondary | ICD-10-CM

## 2023-10-27 MED ORDER — BUPROPION HCL ER (XL) 300 MG PO TB24: 300.0000 mg | ORAL_TABLET | Freq: Every day | ORAL | 1 refills | Status: DC

## 2023-10-27 MED ORDER — DESVENLAFAXINE SUCCINATE ER 100 MG PO TB24: 100.0000 mg | ORAL_TABLET | Freq: Every day | ORAL | 3 refills | Status: DC

## 2023-10-27 MED ORDER — HYDROXYZINE PAMOATE 50 MG PO CAPS: 50.0000 mg | ORAL_CAPSULE | Freq: Every evening | ORAL | 3 refills | Status: DC | PRN

## 2023-10-27 MED ORDER — ALPRAZOLAM 0.5 MG PO TABS: 0.5000 mg | ORAL_TABLET | Freq: Three times a day (TID) | ORAL | 2 refills | Status: DC | PRN

## 2023-10-27 NOTE — Progress Notes (Signed)
[facility]  Patient ID: Ashley Chen,  MRN: [id number]  PCP: Georgianne Fick, MD  Date of Evaluation: 10/27/2023 Time spent:30 minutes  Chief Complaint:  Chief Complaint   Anxiety; Depression; Follow-up; Medication Refill; Patient Education     HISTORY/CURRENT STATUS: HPI    Allergies: 23 year old female presents to this office for follow up and medication management.  Just had surgery for endometriosis. Scars healing ok but has rash in the naval area. No sign of infection.  Follows up with MD later today. Still engaged.  No wedding date set yet. Family is doing better. Doe snot want to change or adjust her medications today but would like to follow up in 3 months. She has cut back on her vaping. Lost 11lbs since last visit.  She reports her anxiety today at 2/10 and depression at 2/10.  Is sleeping ok.  Reports no mania, no psychosis. Does not have SI or HI.    Prior Known Psychiatric medication trials: Prozac Zoloft Wellbutrin Mirtazapine (Worked except for excessive weight gain) Klonopin Buspar Prazosin Intuniv Concerta   Individual Medical History/ Review of Systems: Changes? :No  Current Medications:  Current Outpatient Medications:    albuterol (VENTOLIN HFA) 108 (90 Base) MCG/ACT inhaler, INHALE 1-2 PUFFS INTO THE LUNGS EVERY 6 HOURS AS NEEDED FOR WHEEZING/SHORTNESS OF BREATH, Disp: 6.7 g, Rfl: 0   ALPRAZolam (XANAX) 0.5 MG tablet, TAKE 1 TABLET BY MOUTH 3 TIMES DAILY AS NEEDED., Disp: 90 tablet, Rfl: 2   budesonide-formoterol (SYMBICORT) 160-4.5 MCG/ACT inhaler, Inhale 2 puffs into the lungs in the morning and at bedtime., Disp: 1 each, Rfl: 6   buPROPion (WELLBUTRIN XL) 300 MG 24 hr tablet, TAKE 1 TABLET (300 MG TOTAL) BY MOUTH DAILY., Disp: 30 tablet, Rfl: 3   desvenlafaxine (PRISTIQ) 100 MG 24 hr tablet, Take 1 tablet (100 mg total) by mouth at bedtime., Disp: 90 tablet, Rfl: 3   EPINEPHrine 0.3 mg/0.3 mL IJ SOAJ injection, Inject 0.3 mg  into the muscle once as needed (severe allergic reaction)., Disp: 1 each, Rfl: 1   hydrOXYzine (VISTARIL) 50 MG capsule, Take 1 capsule (50 mg total) by mouth at bedtime as needed for anxiety (insomnia; Panic attack)., Disp: 90 capsule, Rfl: 3   ibuprofen (ADVIL) 600 MG tablet, Take 1 tablet (600 mg total) by mouth every 6 (six) hours as needed for mild pain (pain score 1-3), moderate pain (pain score 4-6) or cramping., Disp: 40 tablet, Rfl: 1   levonorgestrel (MIRENA) 20 MCG/24HR IUD, 1 each by Intrauterine route once. Implanted November 2021, Disp: , Rfl:    montelukast (SINGULAIR) 10 MG tablet, Take 1 tablet (10 mg total) by mouth at bedtime., Disp: 30 tablet, Rfl: 11   OVER THE COUNTER MEDICATION, Take 1 tablet by mouth daily. Pre natal vitamin, Disp: , Rfl:    oxyCODONE (ROXICODONE) 5 MG immediate release tablet, Take 1 tablet (5 mg total) by mouth every 6 (six) hours as needed for severe pain (pain score 7-10), breakthrough pain or moderate pain (pain score 4-6)., Disp: 15 tablet, Rfl: 0 Medication Side Effects: none  Family Medical/ Social History: Changes? No  MENTAL HEALTH EXAM:  There were no vitals taken for this visit.There is no height or weight on file to calculate BMI.  General Appearance: Casual, Neat, and Well Groomed  Eye Contact:  Good  Speech:  Clear and Coherent  Volume:  Normal  Mood:  NA  Affect:  Appropriate  Thought Process:  Coherent  Orientation:  Full (  Time, Place, and Person)  Thought Content: Logical   Suicidal Thoughts:  No  Homicidal Thoughts:  No  Memory:  WNL  Judgement:  Good  Insight:  Good  Psychomotor Activity:  Normal  Concentration:  Concentration: Good  Recall:  Good  Fund of Knowledge: Good  Language: Good  Assets:  Desire for Improvement  ADL's:  Intact  Cognition: WNL  Prognosis:  Good    DIAGNOSES:    ICD-10-CM   1. Chronic post-traumatic stress disorder  F43.12     2. Panic disorder  F41.0     3. Generalized anxiety disorder   F41.1     4. Attention deficit hyperactivity disorder (ADHD), combined type, moderate  F90.2     5. Mild episode of recurrent major depressive disorder (HCC)  F33.0       Receiving Psychotherapy: No    RECOMMENDATIONS:  Continue Xanax 0.5 mg 3 times daily prn for panic attacks Continue Mirtazapine 15 mg at bedtime Continue Pristiq 100 mg at bedtime Continue hydroxyzine 50 mg three times daily prn Continue Wellbutrin to 300 mg XL daily To report any severe side effects promptly Provided emergency contact and after hour information To follow up in 3 months for reassessment per pt   Greater than 50%  of 30 min. face to face time with patient was spent on counseling and coordination of care.  She is happy and smiling today. Happy with medications.  No changes to medication regimen recommended at this time. Refills escribed to patients pharmacy. Refills provided today PDMP reviewed.     Joan Flores, NP

## 2024-01-24 ENCOUNTER — Encounter: Payer: Self-pay | Admitting: Behavioral Health

## 2024-01-24 ENCOUNTER — Ambulatory Visit (INDEPENDENT_AMBULATORY_CARE_PROVIDER_SITE_OTHER): Payer: 59 | Admitting: Behavioral Health

## 2024-01-24 DIAGNOSIS — F330 Major depressive disorder, recurrent, mild: Secondary | ICD-10-CM

## 2024-01-24 DIAGNOSIS — F4312 Post-traumatic stress disorder, chronic: Secondary | ICD-10-CM | POA: Diagnosis not present

## 2024-01-24 DIAGNOSIS — F410 Panic disorder [episodic paroxysmal anxiety] without agoraphobia: Secondary | ICD-10-CM | POA: Diagnosis not present

## 2024-01-24 DIAGNOSIS — F902 Attention-deficit hyperactivity disorder, combined type: Secondary | ICD-10-CM

## 2024-01-24 DIAGNOSIS — F411 Generalized anxiety disorder: Secondary | ICD-10-CM | POA: Diagnosis not present

## 2024-01-24 MED ORDER — ALPRAZOLAM 0.5 MG PO TABS: 0.5000 mg | ORAL_TABLET | Freq: Three times a day (TID) | ORAL | 2 refills | Status: DC | PRN

## 2024-01-24 MED ORDER — DESVENLAFAXINE SUCCINATE ER 100 MG PO TB24: 100.0000 mg | ORAL_TABLET | Freq: Every day | ORAL | 3 refills | Status: DC

## 2024-01-24 MED ORDER — BUPROPION HCL ER (XL) 300 MG PO TB24: 300.0000 mg | ORAL_TABLET | Freq: Every day | ORAL | 1 refills | Status: DC

## 2024-01-24 MED ORDER — HYDROXYZINE PAMOATE 50 MG PO CAPS: 50.0000 mg | ORAL_CAPSULE | Freq: Every evening | ORAL | 3 refills | Status: DC | PRN

## 2024-01-24 NOTE — Progress Notes (Signed)
 [facility]  Patient ID: Ashley Chen,  MRN: [id number]  PCP: Virgle Grime, MD  Date of Evaluation: 01/24/2024 Time spent:30 minutes  Chief Complaint:  Chief Complaint   Depression; Anxiety; Stress; Patient Education; Medication Problem; Family Problem     HISTORY/CURRENT STATUS: HPI  23 year old female presents to this office for follow up and medication management.  She is smiling and says she is doing very well recently. Still engaged. No wedding date yet. Very stressed. Experiencing pain with endometriosis. Following up with OB/Gyn regularly. Worried about the effects on having children. Does not want a  hysterectomy. Does not want to change or adjust her medications today but would like to follow up in 4 months. She has cut back on her vaping. Has lost more weight since last visit. Now 131 lbs.  She reports her anxiety today at 5/10 and depression at 2/10.  Is sleeping ok.  Reports no mania, no psychosis. Does not have SI or HI.    Prior Known Psychiatric medication trials: Prozac  Zoloft Wellbutrin  Mirtazapine  (Worked except for excessive weight gain) Klonopin  Buspar Prazosin  Intuniv  Concerta     Individual Medical History/ Review of Systems: Changes? :No   Allergies: Horse-derived products, Azithromycin, Lactose intolerance (gi), and Other  Current Medications:  Current Outpatient Medications:    albuterol  (VENTOLIN  HFA) 108 (90 Base) MCG/ACT inhaler, INHALE 1-2 PUFFS INTO THE LUNGS EVERY 6 HOURS AS NEEDED FOR WHEEZING/SHORTNESS OF BREATH, Disp: 6.7 g, Rfl: 0   ALPRAZolam  (XANAX ) 0.5 MG tablet, Take 1 tablet (0.5 mg total) by mouth 3 (three) times daily as needed., Disp: 90 tablet, Rfl: 2   budesonide -formoterol  (SYMBICORT ) 160-4.5 MCG/ACT inhaler, Inhale 2 puffs into the lungs in the morning and at bedtime., Disp: 1 each, Rfl: 6   buPROPion  (WELLBUTRIN  XL) 300 MG 24 hr tablet, Take 1 tablet (300 mg total) by mouth daily., Disp: 90 tablet,  Rfl: 1   desvenlafaxine  (PRISTIQ ) 100 MG 24 hr tablet, Take 1 tablet (100 mg total) by mouth at bedtime., Disp: 90 tablet, Rfl: 3   EPINEPHrine  0.3 mg/0.3 mL IJ SOAJ injection, Inject 0.3 mg into the muscle once as needed (severe allergic reaction)., Disp: 1 each, Rfl: 1   hydrOXYzine  (VISTARIL ) 50 MG capsule, Take 1 capsule (50 mg total) by mouth at bedtime as needed for anxiety (insomnia; Panic attack)., Disp: 90 capsule, Rfl: 3   ibuprofen  (ADVIL ) 600 MG tablet, Take 1 tablet (600 mg total) by mouth every 6 (six) hours as needed for mild pain (pain score 1-3), moderate pain (pain score 4-6) or cramping., Disp: 40 tablet, Rfl: 1   levonorgestrel  (MIRENA ) 20 MCG/24HR IUD, 1 each by Intrauterine route once. Implanted November 2021, Disp: , Rfl:    montelukast  (SINGULAIR ) 10 MG tablet, Take 1 tablet (10 mg total) by mouth at bedtime., Disp: 30 tablet, Rfl: 11   OVER THE COUNTER MEDICATION, Take 1 tablet by mouth daily. Pre natal vitamin, Disp: , Rfl:    oxyCODONE  (ROXICODONE ) 5 MG immediate release tablet, Take 1 tablet (5 mg total) by mouth every 6 (six) hours as needed for severe pain (pain score 7-10), breakthrough pain or moderate pain (pain score 4-6)., Disp: 15 tablet, Rfl: 0 Medication Side Effects: none  Family Medical/ Social History: Changes? No  MENTAL HEALTH EXAM:  There were no vitals taken for this visit.There is no height or weight on file to calculate BMI.  General Appearance: Casual, Neat, and Well Groomed  Eye Contact:  Good  Speech:  Clear  and Coherent  Volume:  Normal  Mood:  NA  Affect:  Appropriate  Thought Process:  Coherent  Orientation:  Full (Time, Place, and Person)  Thought Content: Logical   Suicidal Thoughts:  No  Homicidal Thoughts:  No  Memory:  WNL  Judgement:  Good  Insight:  Good  Psychomotor Activity:  Normal  Concentration:  Concentration: Good  Recall:  Good  Fund of Knowledge: Good  Language: Good  Assets:  Desire for Improvement  ADL's:   Intact  Cognition: WNL  Prognosis:  Good    DIAGNOSES:    ICD-10-CM   1. Chronic post-traumatic stress disorder  F43.12 hydrOXYzine  (VISTARIL ) 50 MG capsule    ALPRAZolam  (XANAX ) 0.5 MG tablet    desvenlafaxine  (PRISTIQ ) 100 MG 24 hr tablet    buPROPion  (WELLBUTRIN  XL) 300 MG 24 hr tablet    2. Panic disorder  F41.0 hydrOXYzine  (VISTARIL ) 50 MG capsule    ALPRAZolam  (XANAX ) 0.5 MG tablet    desvenlafaxine  (PRISTIQ ) 100 MG 24 hr tablet    3. Attention deficit hyperactivity disorder (ADHD), combined type, moderate  F90.2 desvenlafaxine  (PRISTIQ ) 100 MG 24 hr tablet    buPROPion  (WELLBUTRIN  XL) 300 MG 24 hr tablet    4. Generalized anxiety disorder  F41.1 buPROPion  (WELLBUTRIN  XL) 300 MG 24 hr tablet    5. Mild episode of recurrent major depressive disorder (HCC)  F33.0 buPROPion  (WELLBUTRIN  XL) 300 MG 24 hr tablet      Receiving Psychotherapy: No    RECOMMENDATIONS:   Continue Xanax  0.5 mg 3 times daily prn for panic attacks Continue Mirtazapine  15 mg at bedtime Continue Pristiq  100 mg at bedtime Continue hydroxyzine  50 mg three times daily prn Continue Wellbutrin  to 300 mg XL daily To report any severe side effects promptly Provided emergency contact and after hour information To follow up in 3 months for reassessment per pt   Greater than 50%  of 30 min. face to face time with patient was spent on counseling and coordination of care.  Very stress today. Frustrated with not having pain relief from endometriosis. Once surgery so far but not a lot of options.  Worries about having children.  Happy with medications.  No changes to medication regimen recommended at this time. Refills escribed to patients pharmacy. Refills provided today PDMP reviewed.   Lincoln Renshaw, NP

## 2024-02-17 ENCOUNTER — Encounter: Payer: Self-pay | Admitting: *Deleted

## 2024-02-17 ENCOUNTER — Ambulatory Visit
Admission: EM | Admit: 2024-02-17 | Discharge: 2024-02-17 | Disposition: A | Discharge status: Home / Self Care | Attending: Family Medicine | Admitting: Family Medicine

## 2024-02-17 DIAGNOSIS — J4521 Mild intermittent asthma with (acute) exacerbation: Secondary | ICD-10-CM

## 2024-02-17 DIAGNOSIS — R051 Acute cough: Secondary | ICD-10-CM | POA: Diagnosis not present

## 2024-02-17 LAB — POC SARS CORONAVIRUS 2 AG -  ED: SARS Coronavirus 2 Ag: NEGATIVE

## 2024-02-17 MED ORDER — PREDNISONE 20 MG PO TABS: 40.0000 mg | ORAL_TABLET | Freq: Every day | ORAL | 0 refills | Status: AC

## 2024-02-17 MED ORDER — BENZONATATE 100 MG PO CAPS: 100.0000 mg | ORAL_CAPSULE | Freq: Three times a day (TID) | ORAL | 0 refills | Status: DC | PRN

## 2024-02-17 NOTE — ED Triage Notes (Signed)
 Pt reports dry "painful cough" x 2-3 days. She has used her inhaler but not her nebulizer. Hx of asthma. Denies fever. Taking singulair  and zyrtec

## 2024-02-17 NOTE — ED Provider Notes (Signed)
 [facility]    CSN: [id number] Arrival date & time: 02/17/24  1617      History   Chief Complaint Chief Complaint  Patient presents with   Cough    HPI Ashley Chen is a 23 y.o. female.    Cough Here for a dry cough that is been bothering her for about 3 or 4 days.  She has had a headache but no nasal congestion or rhinorrhea.  No fever and no vomiting or diarrhea.  She has been wheezing and she has felt tight in her chest.  Albuterol  is only helping a little bit.   It hurts in her chest when she coughs.  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   Menorrhagia 08/14/2014    Past Surgical History:  Procedure Laterality Date   FOOT SURGERY     labial surgery     LAPAROSCOPY N/A 10/03/2023   Procedure: LAPAROSCOPY DIAGNOSTIC excision endometiotic implant;  Surgeon: Renea Carrion, MD;  Location: [facility];  Service: Gynecology;  Laterality: N/A;   TONSILECTOMY, ADENOIDECTOMY, BILATERAL MYRINGOTOMY AND TUBES     before age 88   TONSILLECTOMY AND ADENOIDECTOMY     umblical hernia repair     before age 86    OB History     Gravida  0   Para  0   Term  0   Preterm  0   AB  0   Living  0      SAB  0   IAB  0   Ectopic  0   Multiple  0   Live Births  0            Home Medications    Prior to Admission medications   Medication Sig Start Date End Date Taking? Authorizing Provider  benzonatate  (TESSALON ) 100  MG capsule Take 1 capsule (100 mg total) by mouth 3 (three) times daily as needed for cough. 02/17/24  Yes Katrina Brosh K, MD  cetirizine (ZYRTEC) 10 MG tablet Take 10 mg by mouth daily. 08/14/14  Yes [provider]  predniSONE  (DELTASONE ) 20 MG tablet Take 2 tablets (40 mg total) by mouth daily with breakfast for 5 days. 02/17/24 02/22/24 Yes Ann Keto, MD  albuterol  (VENTOLIN  HFA) 108 (90 Base) MCG/ACT inhaler INHALE 1-2 PUFFS INTO THE LUNGS EVERY 6 HOURS AS NEEDED FOR WHEEZING/SHORTNESS OF BREATH 06/19/19  Yes Elesa Grills, MD  ALPRAZolam  (XANAX ) 0.5 MG tablet Take 1 tablet (0.5 mg total) by mouth 3 (three) times daily as needed. 01/24/24  Yes White, Mora Apple, NP  budesonide -formoterol  (SYMBICORT ) 160-4.5 MCG/ACT inhaler Inhale 2 puffs into the lungs in the morning and at bedtime. 09/11/21  Yes Aleck Hurdle, MD  buPROPion  (WELLBUTRIN  XL) 300 MG 24 hr tablet Take 1 tablet (300 mg total) by mouth daily. 01/24/24  Yes Marita Sidle A, NP  desvenlafaxine  (PRISTIQ ) 100 MG 24 hr tablet Take 1 tablet (100 mg total) by mouth at  bedtime. 01/24/24  Yes White, Polly Brink A, NP  EPINEPHrine  0.3 mg/0.3 mL IJ SOAJ injection Inject 0.3 mg into the muscle once as needed (severe allergic reaction). 01/19/22  Yes White, Mora Apple, NP  hydrOXYzine  (VISTARIL ) 50 MG capsule Take 1 capsule (50 mg total) by mouth at bedtime as needed for anxiety (insomnia; Panic attack). 01/24/24  Yes White, Polly Brink A, NP  levonorgestrel  (MIRENA ) 20 MCG/24HR IUD 1 each by Intrauterine route once. Implanted November 2021   Yes [provider]  montelukast  (SINGULAIR ) 10 MG tablet Take 1 tablet (10 mg total) by mouth at bedtime. 11/16/21  Yes Desai, Nikita S, MD  OVER THE COUNTER MEDICATION Take 1 tablet by mouth daily. Pre natal vitamin   Yes [provider]    Family History Family History  Problem Relation Age of Onset   Drug abuse Mother    Anxiety disorder Mother    ADD / ADHD Brother    Mental  retardation Brother    Depression Paternal Uncle    Alcohol abuse Paternal Uncle     Social History Social History   Tobacco Use   Smoking status: Every Day    Types: E-cigarettes   Smokeless tobacco: Never   Tobacco comments:    Trying to quit e-cigarettes.  Cut back a lot.  Vaping Use   Vaping status: Every Day   Substances: Nicotine, Flavoring  Substance Use Topics   Alcohol use: Yes    Alcohol/week: 3.0 standard drinks of alcohol    Types: 3 Shots of liquor per week    Comment: once a month   Drug use: Yes    Types: Marijuana     Allergies   Horse-derived products, Azithromycin, Lactose intolerance (gi), and Other   Review of Systems Review of Systems  Respiratory:  Positive for cough.      Physical Exam Triage Vital Signs ED Triage Vitals [02/17/24 1634]  Encounter Vitals Group     BP 115/82     Systolic BP Percentile      Diastolic BP Percentile      Pulse Rate 92     Resp 18     Temp 98.3 F (36.8 C)     Temp Source Oral     SpO2 98 %     Weight      Height      Head Circumference      Peak Flow      Pain Score      Pain Loc      Pain Education      Exclude from Growth Chart    No data found.  Updated Vital Signs BP 115/82 (BP Location: Left Arm)   Pulse 92   Temp 98.3 F (36.8 C) (Oral)   Resp 18   SpO2 98%   Visual Acuity Right Eye Distance:   Left Eye Distance:   Bilateral Distance:    Right Eye Near:   Left Eye Near:    Bilateral Near:     Physical Exam Vitals reviewed.  Constitutional:      General: She is not in acute distress.    Appearance: She is not toxic-appearing.  HENT:     Nose: Nose normal.     Mouth/Throat:     Mouth: Mucous membranes are moist.     Pharynx: No oropharyngeal exudate or posterior oropharyngeal erythema.  Eyes:     Extraocular Movements: Extraocular movements intact.     Conjunctiva/sclera: Conjunctivae normal.     Pupils:  Pupils are equal, round, and reactive to light.  Cardiovascular:      Rate and Rhythm: Normal rate and regular rhythm.     Heart sounds: No murmur heard. Pulmonary:     Effort: No respiratory distress.     Breath sounds: No stridor. No wheezing, rhonchi or rales.     Comments: Air movement is good and there is no wheezing at the time of exam. Musculoskeletal:     Cervical back: Neck supple.  Lymphadenopathy:     Cervical: No cervical adenopathy.  Skin:    Capillary Refill: Capillary refill takes less than 2 seconds.     Coloration: Skin is not jaundiced or pale.  Neurological:     General: No focal deficit present.     Mental Status: She is alert and oriented to person, place, and time.  Psychiatric:        Behavior: Behavior normal.      UC Treatments / Results  Labs (all labs ordered are listed, but only abnormal results are displayed) Labs Reviewed  POC SARS CORONAVIRUS 2 AG -  ED - Normal    EKG   Radiology No results found.  Procedures Procedures (including critical care time)  Medications Ordered in UC Medications - No data to display  Initial Impression / Assessment and Plan / UC Course  I have reviewed the triage vital signs and the nursing notes.  Pertinent labs & imaging results that were available during my care of the patient were reviewed by me and considered in my medical decision making (see chart for details).     COVID test is negative.  5-day burst of prednisone  is sent in for the asthma exacerbation and Tessalon  Perles are sent in for the cough. Final Clinical Impressions(s) / UC Diagnoses   Final diagnoses:  Mild intermittent asthma with acute exacerbation  Acute cough     Discharge Instructions      The COVID test was negative  Take prednisone  20 mg--2 daily for 5 days  Take benzonatate  100 mg, 1 tab every 8 hours as needed for cough.     ED Prescriptions     Medication Sig Dispense Auth. Provider   benzonatate  (TESSALON ) 100 MG capsule Take 1 capsule (100 mg total) by mouth 3 (three)  times daily as needed for cough. 21 capsule Ann Keto, MD   predniSONE  (DELTASONE ) 20 MG tablet Take 2 tablets (40 mg total) by mouth daily with breakfast for 5 days. 10 tablet Ellsworth Haas Arkel Cartwright K, MD      PDMP not reviewed this encounter.   Ann Keto, MD 02/17/24 [phone]

## 2024-02-17 NOTE — Discharge Instructions (Signed)
 The COVID test was negative  Take prednisone  20 mg--2 daily for 5 days  Take benzonatate  100 mg, 1 tab every 8 hours as needed for cough.

## 2024-03-04 ENCOUNTER — Ambulatory Visit
Admission: EM | Admit: 2024-03-04 | Discharge: 2024-03-04 | Disposition: A | Discharge status: Home / Self Care | Attending: Physician Assistant | Admitting: Physician Assistant

## 2024-03-04 DIAGNOSIS — G43909 Migraine, unspecified, not intractable, without status migrainosus: Secondary | ICD-10-CM | POA: Insufficient documentation

## 2024-03-04 DIAGNOSIS — R9431 Abnormal electrocardiogram [ECG] [EKG]: Secondary | ICD-10-CM | POA: Insufficient documentation

## 2024-03-04 DIAGNOSIS — H9193 Unspecified hearing loss, bilateral: Secondary | ICD-10-CM | POA: Insufficient documentation

## 2024-03-04 DIAGNOSIS — J4521 Mild intermittent asthma with (acute) exacerbation: Secondary | ICD-10-CM | POA: Insufficient documentation

## 2024-03-04 DIAGNOSIS — J302 Other seasonal allergic rhinitis: Secondary | ICD-10-CM | POA: Insufficient documentation

## 2024-03-04 DIAGNOSIS — J45909 Unspecified asthma, uncomplicated: Secondary | ICD-10-CM | POA: Insufficient documentation

## 2024-03-04 DIAGNOSIS — G8929 Other chronic pain: Secondary | ICD-10-CM | POA: Insufficient documentation

## 2024-03-04 DIAGNOSIS — J4531 Mild persistent asthma with (acute) exacerbation: Secondary | ICD-10-CM | POA: Insufficient documentation

## 2024-03-04 DIAGNOSIS — Z0000 Encounter for general adult medical examination without abnormal findings: Secondary | ICD-10-CM | POA: Insufficient documentation

## 2024-03-04 DIAGNOSIS — J029 Acute pharyngitis, unspecified: Secondary | ICD-10-CM | POA: Diagnosis not present

## 2024-03-04 DIAGNOSIS — F419 Anxiety disorder, unspecified: Secondary | ICD-10-CM | POA: Insufficient documentation

## 2024-03-04 DIAGNOSIS — F40231 Fear of injections and transfusions: Secondary | ICD-10-CM | POA: Insufficient documentation

## 2024-03-04 LAB — POCT RAPID STREP A (OFFICE): Rapid Strep A Screen: NEGATIVE

## 2024-03-04 NOTE — Discharge Instructions (Signed)
  Try CEPACOL lozenges for sore throat relief. 

## 2024-03-04 NOTE — ED Provider Notes (Signed)
 [facility]    CSN: [id number] Arrival date & time: 03/04/24  1330      History   Chief Complaint Chief Complaint  Patient presents with   Sore Throat    HPI Ashley Chen is a 23 y.o. female.   Patient here today for evaluation of sore throat she has had for the last week.  She states that prior to this she did have some cough and was taking the prednisone  prescribed.  She states her cough did improve.  She notes that she has had surgery to have her tonsils and adenoids removed in the past.  She denies any fever.  She has not any new or unexplained rash.  She has tried using throat lozenges and Chloraseptic spray with mild relief but no resolution.  The history is provided by the patient.  Sore Throat Pertinent negatives include no abdominal pain and no shortness of breath.    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/2024   Seasonal allergies 03/04/2024   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   Anxiety disorder 03/04/2024   Panic disorder 09/06/2018   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   Attention deficit hyperactivity disorder (ADHD), combined type, 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   Moderate malnutrition (HCC) 10/12/2017   Needle phobia 10/12/2017   Chronic post-traumatic stress disorder 06/11/2015   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   Menorrhagia 08/14/2014    Past Surgical History:   Procedure Laterality Date   FOOT SURGERY     labial surgery     LAPAROSCOPY N/A 10/03/2023   Procedure: LAPAROSCOPY DIAGNOSTIC excision endometiotic implant;  Surgeon: Renea Carrion, MD;  Location: [facility];  Service: Gynecology;  Laterality: N/A;   TONSILECTOMY, ADENOIDECTOMY, BILATERAL MYRINGOTOMY AND TUBES     before age 25   TONSILLECTOMY AND ADENOIDECTOMY     umblical hernia repair     before age 82    OB History     Gravida  0   Para  0   Term  0   Preterm  0   AB  0   Living  0      SAB  0   IAB  0   Ectopic  0   Multiple  0   Live Births  0            Home Medications    Prior to Admission medications   Medication Sig Start Date End Date Taking? Authorizing Provider  Biotin 2.5 MG CAPS Take 1 tablet by mouth daily at 2 PM. 12/02/14  Yes [provider]  Docosahexaenoic Acid (ALGAL OMEGA-3 DHA) 200 MG CAPS Take 1 g by mouth. 12/02/14  Yes [provider]  erythromycin ophthalmic ointment 1 Application 4 (four) times daily. 05/09/23  Yes [provider]  ketorolac  (TORADOL ) 10 MG tablet Take 10 mg by mouth every 6 (six) hours as needed. 01/23/24  Yes  [provider]  levalbuterol (XOPENEX) 1.25 MG/3ML nebulizer solution 3 ml Inhalation every 8 hrs for 30 days 08/20/19  Yes [provider]  Lidocaine -Prilocaine (EMLA EX) Apply liberal amount of cream to vulva every three hours as needed. 11/12/14  Yes [provider]  lisdexamfetamine (VYVANSE ) 30 MG capsule Take 30 mg by mouth daily. 09/06/18  Yes [provider]  meloxicam (MOBIC) 15 MG tablet Take 15 mg by mouth daily as needed. 11/17/23  Yes [provider]  mirtazapine  (REMERON ) 30 MG tablet Take 30 mg by mouth at bedtime. 08/08/18  Yes [provider]  norethindrone (AYGESTIN) 5 MG tablet Take 10 mg by mouth daily. 09/09/23  Yes [provider]  nystatin-triamcinolone ointment (MYCOLOG) Apply 1  Application topically 2 (two) times daily. 10/27/23  Yes [provider]  oxyCODONE  (OXY IR/ROXICODONE ) 5 MG immediate release tablet Take 5 mg by mouth every 6 (six) hours as needed. 12/12/23  Yes [provider]  promethazine  (PHENERGAN ) 12.5 MG tablet Take 12.5 mg by mouth every 6 (six) hours as needed. 11/08/14  Yes [provider]  albuterol  (VENTOLIN  HFA) 108 (90 Base) MCG/ACT inhaler INHALE 1-2 PUFFS INTO THE LUNGS EVERY 6 HOURS AS NEEDED FOR WHEEZING/SHORTNESS OF BREATH 06/19/19   Elesa Grills, MD  ALPRAZolam  (XANAX ) 0.5 MG tablet Take 1 tablet (0.5 mg total) by mouth 3 (three) times daily as needed. 01/24/24   Lincoln Renshaw, NP  benzonatate  (TESSALON ) 100 MG capsule Take 1 capsule (100 mg total) by mouth 3 (three) times daily as needed for cough. 02/17/24   Ann Keto, MD  budesonide -formoterol  (SYMBICORT ) 160-4.5 MCG/ACT inhaler Inhale 2 puffs into the lungs in the morning and at bedtime. 09/11/21   Aleck Hurdle, MD  buPROPion  (WELLBUTRIN  XL) 300 MG 24 hr tablet Take 1 tablet (300 mg total) by mouth daily. 01/24/24   Lincoln Renshaw, NP  cetirizine (ZYRTEC) 10 MG tablet Take 10 mg by mouth daily. 08/14/14  Yes [provider]  desvenlafaxine  (PRISTIQ ) 100 MG 24 hr tablet Take 1 tablet (100 mg total) by mouth at bedtime. 01/24/24   Lincoln Renshaw, NP  EPINEPHrine  0.3 mg/0.3 mL IJ SOAJ injection Inject 0.3 mg into the muscle once as needed (severe allergic reaction). 01/19/22   Lincoln Renshaw, NP  hydrOXYzine  (ATARAX ) 10 MG tablet Take 10 mg by mouth daily at 2 PM.    [provider]  hydrOXYzine  (VISTARIL ) 50 MG capsule Take 1 capsule (50 mg total) by mouth at bedtime as needed for anxiety (insomnia; Panic attack). 01/24/24   Lincoln Renshaw, NP  levonorgestrel  (MIRENA ) 20 MCG/24HR IUD 1 each by Intrauterine route once. Implanted November 2021   Yes [provider]  montelukast  (SINGULAIR ) 10 MG tablet Take 1 tablet (10 mg total) by  mouth at bedtime. 11/16/21  Yes Aleck Hurdle, MD  ondansetron  (ZOFRAN -ODT) 8 MG disintegrating tablet Take 8 mg by mouth every 8 (eight) hours as needed.    [provider]  OVER THE COUNTER MEDICATION Take 1 tablet by mouth daily. Pre natal vitamin    [provider]  predniSONE  (DELTASONE ) 20 MG tablet Take 20 mg by mouth as directed.    [provider]  valACYclovir (VALTREX) 1000 MG tablet Take 1,000 mg by mouth daily.    [provider]    Family History Family History  Problem Relation Age of Onset   Drug abuse Mother    Anxiety disorder Mother  ADD / ADHD Brother    Mental retardation Brother    Depression Paternal Uncle    Alcohol abuse Paternal Uncle     Social History Social History   Tobacco Use   Smoking status: Never   Smokeless tobacco: Never   Tobacco comments:    Trying to quit e-cigarettes.  Cut back a lot.  Vaping Use   Vaping status: Every Day   Substances: Nicotine, Flavoring  Substance Use Topics   Alcohol use: Yes    Alcohol/week: 3.0 standard drinks of alcohol    Types: 3 Shots of liquor per week    Comment: once a month   Drug use: Yes    Types: Marijuana     Allergies   Horse epithelium, Horse-derived products, Other, Azithromycin, and Lactose intolerance (gi)   Review of Systems Review of Systems  Constitutional:  Negative for chills and fever.  HENT:  Positive for sore throat. Negative for congestion and ear pain.   Eyes:  Negative for discharge and redness.  Respiratory:  Positive for cough (improved). Negative for shortness of breath and wheezing.   Gastrointestinal:  Negative for abdominal pain, diarrhea, nausea and vomiting.     Physical Exam Triage Vital Signs ED Triage Vitals  Encounter Vitals Group     BP 03/04/24 1349 113/77     Systolic BP Percentile --      Diastolic BP Percentile --      Pulse Rate 03/04/24 1349 100     Resp 03/04/24 1349 18     Temp 03/04/24 1349 98.1 F (36.7  C)     Temp Source 03/04/24 1349 Oral     SpO2 03/04/24 1349 98 %     Weight 03/04/24 1346 130 lb (59 kg)     Height 03/04/24 1346 5\' 3"  (1.6 m)     Head Circumference --      Peak Flow --      Pain Score 03/04/24 1344 5     Pain Loc --      Pain Education --      Exclude from Growth Chart --    No data found.  Updated Vital Signs BP 113/77 (BP Location: Left Arm)   Pulse 100   Temp 98.1 F (36.7 C) (Oral)   Resp 18   Ht 5\' 3"  (1.6 m)   Wt 130 lb (59 kg)   LMP  (LMP Unknown)   SpO2 98%   BMI 23.03 kg/m   Visual Acuity Right Eye Distance:   Left Eye Distance:   Bilateral Distance:    Right Eye Near:   Left Eye Near:    Bilateral Near:     Physical Exam Vitals and nursing note reviewed.  Constitutional:      General: She is not in acute distress.    Appearance: Normal appearance. She is not ill-appearing.  HENT:     Head: Normocephalic and atraumatic.     Nose: No congestion or rhinorrhea.     Mouth/Throat:     Mouth: Mucous membranes are moist.     Pharynx: Posterior oropharyngeal erythema present. No oropharyngeal exudate.     Comments: PND noted Eyes:     Conjunctiva/sclera: Conjunctivae normal.  Cardiovascular:     Rate and Rhythm: Normal rate and regular rhythm.     Heart sounds: Normal heart sounds. No murmur heard. Pulmonary:     Effort: Pulmonary effort is normal. No respiratory distress.     Breath sounds: Normal breath sounds. No wheezing,  rhonchi or rales.  Skin:    General: Skin is warm and dry.  Neurological:     Mental Status: She is alert.  Psychiatric:        Mood and Affect: Mood normal.        Thought Content: Thought content normal.      UC Treatments / Results  Labs (all labs ordered are listed, but only abnormal results are displayed) Labs Reviewed  POCT RAPID STREP A (OFFICE) - Normal  CULTURE, GROUP A STREP [facility])    EKG   Radiology No results found.  Procedures Procedures (including critical care  time)  Medications Ordered in UC Medications - No data to display  Initial Impression / Assessment and Plan / UC Course  I have reviewed the triage vital signs and the nursing notes.  Pertinent labs & imaging results that were available during my care of the patient were reviewed by me and considered in my medical decision making (see chart for details).    Discussed PND on exam and suspect this could be the cause of her sore throat but discussed other etiologies as well including viral illness.  Patient declines Monospot testing today.  Rapid strep screening negative in office but will order throat culture.  Advised follow-up with any worsening symptoms or failure to improve.  Final Clinical Impressions(s) / UC Diagnoses   Final diagnoses:  Acute pharyngitis, unspecified etiology     Discharge Instructions       Try CEPACOL lozenges for sore throat relief.      ED Prescriptions   None    PDMP not reviewed this encounter.   Vernestine Gondola, PA-C 03/04/24 [phone]

## 2024-03-04 NOTE — ED Triage Notes (Signed)
"  For over a week now I have had a sore throat, the past 3 days it has been really bad (I have had my Tonsils and Adenoids out)". No fever. No new/unexplained rash.

## 2024-03-07 ENCOUNTER — Ambulatory Visit (HOSPITAL_COMMUNITY): Payer: Self-pay

## 2024-03-07 LAB — CULTURE, GROUP A STREP (THRC)

## 2024-03-27 ENCOUNTER — Ambulatory Visit
Admission: RE | Admit: 2024-03-27 | Discharge: 2024-03-27 | Disposition: A | Discharge status: Home / Self Care | Source: Ambulatory Visit | Attending: Family Medicine | Admitting: Family Medicine

## 2024-03-27 VITALS — BP 122/78 | HR 112 | Temp 98.7°F | Resp 20 | Ht 63.0 in | Wt 130.1 lb

## 2024-03-27 DIAGNOSIS — F902 Attention-deficit hyperactivity disorder, combined type: Secondary | ICD-10-CM

## 2024-03-27 DIAGNOSIS — J45901 Unspecified asthma with (acute) exacerbation: Secondary | ICD-10-CM | POA: Diagnosis not present

## 2024-03-27 DIAGNOSIS — H60392 Other infective otitis externa, left ear: Secondary | ICD-10-CM

## 2024-03-27 MED ORDER — DEXAMETHASONE SODIUM PHOSPHATE 10 MG/ML IJ SOLN
10.0000 mg | Freq: Once | INTRAMUSCULAR | Status: AC
  Administered 2024-03-27: 10 mg via INTRAMUSCULAR

## 2024-03-27 MED ORDER — CIPROFLOXACIN-DEXAMETHASONE 0.3-0.1 % OT SUSP: 4.0000 [drp] | Freq: Two times a day (BID) | OTIC | 0 refills | Status: AC

## 2024-03-27 MED ORDER — ALBUTEROL SULFATE HFA 108 (90 BASE) MCG/ACT IN AERS: 1.0000 | INHALATION_SPRAY | Freq: Four times a day (QID) | RESPIRATORY_TRACT | 0 refills | Status: AC | PRN

## 2024-03-27 MED ORDER — PREDNISONE 20 MG PO TABS: 40.0000 mg | ORAL_TABLET | Freq: Every day | ORAL | 0 refills | Status: AC

## 2024-03-27 NOTE — ED Triage Notes (Signed)
 Ear pain & runny nose. I think i have an ear infection because it is throbbing & sensitive. I also have a bad cough but i took a covid test and it was negative. - Entered by patient

## 2024-03-27 NOTE — Discharge Instructions (Addendum)
 A refill of your albuterol  has been sent to your pharmacy  You can use albuterol  (1-2 puffs) every 4 hours for the next 24-48 hours to treat your asthma exacerbation, then use it every 6 hours as needed  You received one dose of dexamethasone  (steroid injection) today. Tomorrow, please start prednisone  (oral steroid) and take it daily for 4 more days as prescribed  To treat your ear, use ciprodex drops twice daily for 7 days in your left ear as prescribed  Please seek medical attention if your symptoms worsen or do not improve, especially if you experience difficulty breathing or worsening of your wheezing  Continue over the counter medications if desired for cough - unfortunately there is not a lot to specifically treat cough. You can try honey water , humidified air, or netty pots to help with symptoms.

## 2024-03-27 NOTE — ED Provider Notes (Signed)
 [facility]    CSN: [id number] Arrival date & time: 03/27/24  1552      History   Chief Complaint Chief Complaint  Patient presents with   Ear Drainage   Asthma Exacerbation    HPI Ashley Chen is a 23 y.o. female.   Ear pain and runny nose, concern for ear infection Symptoms ongoing x 2-3 days No fevers Went swimming a couple of days ago. Has hx of ear infection. Used a q-tip and noticed some gunk on it from left ear Also has had severe cough at night, feels her asthma is acting up Uses symbicort  BID and albuterol  prn - also has nebulizer - has been using inhaler and nebs with minimal improvement in cough Last dose of symbicort  about 36hrs ago  Denies SOB/CP, abd pain, N/V/D   Ear Drainage    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/2024   Seasonal allergies 03/04/2024   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   Anxiety disorder 03/04/2024   Panic disorder 09/06/2018   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   Attention deficit hyperactivity disorder (ADHD), combined type, 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   Moderate malnutrition (HCC) 10/12/2017   Needle phobia 10/12/2017   Chronic post-traumatic stress disorder 06/11/2015   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   Menorrhagia 08/14/2014    Past Surgical History:  Procedure Laterality Date   FOOT SURGERY     labial surgery      LAPAROSCOPY N/A 10/03/2023   Procedure: LAPAROSCOPY DIAGNOSTIC excision endometiotic implant;  Surgeon: Henry Slough, MD;  Location: [facility];  Service: Gynecology;  Laterality: N/A;   TONSILECTOMY, ADENOIDECTOMY, BILATERAL MYRINGOTOMY AND TUBES     before age 66   TONSILLECTOMY AND ADENOIDECTOMY     umblical hernia repair     before age 48    OB History     Gravida  0   Para  0   Term  0   Preterm  0   AB  0   Living  0      SAB  0   IAB  0   Ectopic  0   Multiple  0   Live Births  0            Home Medications    Prior to Admission medications   Medication Sig Start Date End Date Taking? Authorizing Provider  budesonide -formoterol  (SYMBICORT ) 160-4.5 MCG/ACT inhaler Inhale 2 puffs into the lungs in the morning and at bedtime. 09/11/21  Yes Desai, Nikita S, MD  ciprofloxacin-dexamethasone  (CIPRODEX) OTIC suspension Place 4 drops into the left ear 2 (two) times daily for 7 days. 03/27/24 04/03/24 Yes Romelle Booty, MD  cyclobenzaprine (FLEXERIL) 5 MG tablet Take 5 mg by mouth 3 (three) times daily as needed. 03/12/24  Yes [provider]  fluconazole  (DIFLUCAN ) 150 MG tablet Take 150 mg by mouth every 3 (three) days. 03/27/24  Yes [provider]  levalbuterol (XOPENEX) 1.25 MG/3ML nebulizer solution Take 1.25 mg by nebulization every 8 (eight) hours as needed. 08/20/19  Yes [provider]  predniSONE  (DELTASONE ) 20 MG tablet Take 2 tablets (40 mg total) by mouth daily with breakfast for 4 days. 03/28/24 04/01/24 Yes Romelle Booty, MD  albuterol  (VENTOLIN  HFA) 108 (90 Base) MCG/ACT inhaler Inhale 1-2 puffs into the lungs every 6 (six) hours as needed for wheezing or shortness of breath. 03/27/24   Romelle Booty, MD  ALPRAZolam  (XANAX ) 0.5 MG tablet Take 1 tablet (0.5 mg total) by mouth 3 (three) times daily as needed. 01/24/24   Teresa Redell LABOR, NP  benzonatate  (TESSALON ) 100 MG capsule Take 1 capsule (100 mg total) by mouth 3 (three)  times daily as needed for cough. 02/17/24   Banister, Pamela K, MD  Biotin 2.5 MG CAPS Take 1 tablet by mouth daily at 2 PM. 12/02/14   [provider]  buPROPion  (WELLBUTRIN  XL) 300 MG 24 hr tablet Take 1 tablet (300 mg total) by mouth daily. 01/24/24   Teresa Redell LABOR, NP  cetirizine (ZYRTEC) 10 MG tablet Take 10 mg by mouth daily. 08/14/14   [provider]  desvenlafaxine  (PRISTIQ ) 100 MG 24 hr tablet Take 1 tablet (100 mg total) by mouth at bedtime. 01/24/24   Teresa Redell LABOR, NP  Docosahexaenoic Acid (ALGAL OMEGA-3 DHA) 200 MG CAPS Take 1 g by mouth. 12/02/14   [provider]  EPINEPHrine  0.3 mg/0.3 mL IJ SOAJ injection Inject 0.3 mg into the muscle once as needed (severe allergic reaction). 01/19/22   Teresa Redell LABOR, NP  erythromycin ophthalmic ointment 1 Application 4 (four) times daily. 05/09/23   [provider]  hydrOXYzine  (ATARAX ) 10 MG tablet Take 10 mg by mouth daily at 2 PM.    [provider]  hydrOXYzine  (VISTARIL ) 50 MG capsule Take 1 capsule (50 mg total) by mouth at bedtime as needed for anxiety (insomnia; Panic attack). 01/24/24   Teresa Redell LABOR, NP  ketorolac  (TORADOL ) 10 MG tablet Take 10 mg by mouth every 6 (six) hours as needed. 01/23/24   [provider]  levalbuterol (XOPENEX) 1.25 MG/3ML nebulizer solution 3 ml Inhalation every 8 hrs for 30 days 08/20/19   [provider]  levonorgestrel  (MIRENA ) 20 MCG/24HR IUD 1 each by Intrauterine route once. Implanted November 2021    [provider]  Lidocaine -Prilocaine (EMLA EX) Apply liberal amount of cream to vulva every three hours as needed. 11/12/14   [provider]  lisdexamfetamine (VYVANSE ) 30 MG capsule Take 30 mg by mouth daily. 09/06/18   [provider]  meloxicam (MOBIC) 15 MG tablet Take 15 mg by mouth daily as needed. 11/17/23   [provider]  mirtazapine  (REMERON ) 30 MG tablet Take 30 mg by mouth at bedtime. 08/08/18   [provider]  montelukast  (SINGULAIR ) 10 MG tablet Take 1 tablet (10 mg total) by mouth at bedtime. 11/16/21   Desai, Nikita S, MD  norethindrone (AYGESTIN) 5 MG tablet Take 10 mg by mouth daily. 09/09/23   [provider]  nystatin-triamcinolone ointment (MYCOLOG) Apply 1 Application topically 2 (two) times daily. 10/27/23   [provider]  ondansetron  (ZOFRAN -ODT) 8 MG disintegrating tablet Take 8 mg by mouth every 8 (eight) hours as needed.    [provider]  OVER THE COUNTER MEDICATION Take 1 tablet by mouth daily. Pre natal vitamin    [provider]  oxyCODONE  (OXY IR/ROXICODONE ) 5  MG immediate release tablet Take 5 mg by mouth every 6 (six) hours as needed. 12/12/23   [provider]  promethazine  (PHENERGAN ) 12.5 MG tablet Take 12.5 mg by mouth every 6 (six) hours as needed. 11/08/14   [provider]  valACYclovir (VALTREX) 1000 MG tablet Take 1,000 mg by mouth daily.    [provider]    Family History Family History  Problem Relation Age of Onset   Drug abuse Mother    Anxiety disorder Mother    ADD / ADHD Brother    Mental retardation Brother    Depression Paternal Uncle    Alcohol abuse Paternal Uncle     Social History Social History   Tobacco Use   Smoking status: Never   Smokeless tobacco: Never   Tobacco comments:    Trying to quit e-cigarettes.  Cut back a lot.  Vaping Use   Vaping status: Every Day   Substances: Nicotine, Flavoring  Substance Use Topics   Alcohol use: Yes    Alcohol/week: 3.0 standard drinks of alcohol    Types: 3 Shots of liquor per week    Comment: once a month   Drug use: Yes    Types: Marijuana     Allergies   Horse epithelium, Horse-derived products, Other, Azithromycin, and Lactose intolerance (gi)   Review of Systems Review of Systems per HPI   Physical Exam Triage Vital Signs ED Triage Vitals  Encounter Vitals Group     BP      Girls Systolic BP  Percentile      Girls Diastolic BP Percentile      Boys Systolic BP Percentile      Boys Diastolic BP Percentile      Pulse      Resp      Temp      Temp src      SpO2      Weight      Height      Head Circumference      Peak Flow      Pain Score      Pain Loc      Pain Education      Exclude from Growth Chart    No data found.  Updated Vital Signs BP 122/78 (BP Location: Left Arm)   Pulse (!) 112   Temp 98.7 F (37.1 C) (Oral)   Resp 20   Ht 5' 3 (1.6 m)   Wt 59 kg   LMP  (LMP Unknown)   SpO2 97%   BMI 23.04 kg/m   Visual Acuity Right Eye Distance:   Left Eye Distance:   Bilateral Distance:    Right Eye Near:   Left Eye Near:    Bilateral Near:     Physical Exam Constitutional:      General: She is not in acute distress.    Appearance: She is not toxic-appearing.  HENT:     Head: Normocephalic and atraumatic.     Right Ear: Tympanic membrane and ear canal normal.     Left Ear: Tympanic membrane and ear canal normal.     Ears:     Comments: L external ear tender to palpation at tragus, some pain with manipulation of external ear. No drainage or effusion noted    Nose: Nose normal. No congestion or rhinorrhea.     Mouth/Throat:     Mouth: Mucous membranes are moist.     Pharynx: Oropharynx is clear. No oropharyngeal exudate or posterior oropharyngeal  erythema.   Eyes:     Extraocular Movements: Extraocular movements intact.     Pupils: Pupils are equal, round, and reactive to light.    Cardiovascular:     Rate and Rhythm: Regular rhythm. Tachycardia present.     Heart sounds: Normal heart sounds. No murmur heard. Pulmonary:     Effort: Pulmonary effort is normal.     Breath sounds: Wheezing present.     Comments: Faint biphasic wheezing b/l lower lobes, inspiratory wheezing b/l mid lobes Abdominal:     General: Abdomen is flat. There is no distension.     Palpations: Abdomen is soft.     Tenderness: There is no abdominal tenderness.    Musculoskeletal:        General: No swelling or deformity.     Cervical back: Normal range of motion and neck supple. No rigidity or tenderness.   Skin:    General: Skin is warm and dry.   Neurological:     General: No focal deficit present.     Mental Status: She is alert. Mental status is at baseline.      UC Treatments / Results  Labs (all labs ordered are listed, but only abnormal results are displayed) Labs Reviewed - No data to display  EKG   Radiology No results found.  Procedures Procedures (including critical care time)  Medications Ordered in UC Medications  dexamethasone  (DECADRON ) injection 10 mg (10 mg Intramuscular Given 03/27/24 1642)    Initial Impression / Assessment and Plan / UC Course  I have reviewed the triage vital signs and the nursing notes.  Pertinent labs & imaging results that were available during my care of the patient were reviewed by me and considered in my medical decision making (see chart for details).     Patient presenting with left ear pain as well as concern for asthma exacerbation On exam, left ear tenderness without obvious drainage or signs of otitis media Wheezing noted on exam  Reassuringly afebrile, on room air.  Suspect tachycardia in the  setting of recent albuterol  use.  With wheezing on exam we will treat with Decadron  here x 1 dose and 4-day course of prednisone  starting tomorrow.  Advised to use albuterol  inhaler every 4-6 hours for the next 24 to 48 hours then as needed.  Sent refill of albuterol  inhaler per request.  For her ear, given history of recent swimming with tenderness on exam I feel it is reasonable to treat for otitis externa.  Rx Ciprodex twice daily x 7 days  Discussed supportive care and return precautions  Final Clinical Impressions(s) / UC Diagnoses   Final diagnoses:  Asthma with acute exacerbation, unspecified asthma severity, unspecified whether persistent  Infective otitis externa of  left ear     Discharge Instructions      A refill of your albuterol  has been sent to your pharmacy  You can use albuterol  (1-2 puffs) every 4 hours for the next 24-48 hours to treat your asthma exacerbation, then use it every 6 hours as needed  You received one dose of dexamethasone  (steroid injection) today. Tomorrow, please start prednisone  (oral steroid) and take it daily for 4 more days as prescribed  To treat your ear, use ciprodex drops twice daily for 7 days in your left ear as prescribed  Please seek medical attention if your symptoms worsen or do not improve, especially if you experience difficulty breathing or worsening of your wheezing  Continue over the counter medications if desired for  cough - unfortunately there is not a lot to specifically treat cough. You can try honey water , humidified air, or netty pots to help with symptoms.     ED Prescriptions     Medication Sig Dispense Auth. Provider   ciprofloxacin-dexamethasone  (CIPRODEX) OTIC suspension Place 4 drops into the left ear 2 (two) times daily for 7 days. 3 mL Romelle Booty, MD   predniSONE  (DELTASONE ) 20 MG tablet Take 2 tablets (40 mg total) by mouth daily with breakfast for 4 days. 8 tablet Romelle Booty, MD   albuterol  (VENTOLIN  HFA) 108 (90 Base) MCG/ACT inhaler Inhale 1-2 puffs into the lungs every 6 (six) hours as needed for wheezing or shortness of breath. 6.7 g Romelle Booty, MD      PDMP not reviewed this encounter.   Romelle Booty, MD 03/27/24 [phone]

## 2024-05-02 ENCOUNTER — Other Ambulatory Visit: Payer: Self-pay | Admitting: Behavioral Health

## 2024-05-02 DIAGNOSIS — F4312 Post-traumatic stress disorder, chronic: Secondary | ICD-10-CM

## 2024-05-02 DIAGNOSIS — F410 Panic disorder [episodic paroxysmal anxiety] without agoraphobia: Secondary | ICD-10-CM

## 2024-05-10 ENCOUNTER — Ambulatory Visit

## 2024-05-10 VITALS — BP 118/79 | HR 111 | Temp 97.9°F | Ht 63.0 in | Wt 132.6 lb

## 2024-05-10 DIAGNOSIS — J4520 Mild intermittent asthma, uncomplicated: Secondary | ICD-10-CM | POA: Diagnosis not present

## 2024-05-10 DIAGNOSIS — F17290 Nicotine dependence, other tobacco product, uncomplicated: Secondary | ICD-10-CM | POA: Diagnosis not present

## 2024-05-10 MED ORDER — BUDESONIDE-FORMOTEROL FUMARATE 160-4.5 MCG/ACT IN AERO: 2.0000 | INHALATION_SPRAY | Freq: Two times a day (BID) | RESPIRATORY_TRACT | 6 refills | Status: AC

## 2024-05-10 NOTE — Progress Notes (Deleted)
 Subjective:   PATIENT ID: Ashley Chen GENDER: female DOB: August 26, 2001, MRN: [id number]   HPI   Past Medical History:  Diagnosis Date   Anxiety    Asthma    Depression    Migraine    PONV (postoperative nausea and vomiting)    Seasonal allergies      Family History  Problem Relation Age of Onset   Drug abuse Mother    Anxiety disorder Mother    ADD / ADHD Brother    Mental retardation Brother    Depression Paternal Uncle    Alcohol abuse Paternal Uncle      Social History   Socioeconomic History   Marital status: Single    Spouse name: Not on file   Number of children: Not on file   Years of education: 12   Highest education level: High school graduate  Occupational History   Not on file  Tobacco Use   Smoking status: Never   Smokeless tobacco: Never   Tobacco comments:    Trying to quit e-cigarettes.  Cut back a lot.  Vaping Use   Vaping status: Every Day   Substances: Nicotine, Flavoring  Substance and Sexual Activity   Alcohol use: Yes    Alcohol/week: 3.0 standard drinks of alcohol    Types: 3 Shots of liquor per week    Comment: once a month   Drug use: Yes    Types: Marijuana   Sexual activity: Yes    Partners: Male    Birth control/protection: I.U.D.  Other Topics Concern   Not on file  Social History Narrative   Lives with boyfriend in [place]. She is working as a Social worker currently.    Social Drivers of Corporate investment banker Strain: Not on file  Food Insecurity: Not on file  Transportation Needs: Not on file  Physical Activity: Not on file  Stress: Not on file  Social Connections: Not on file  Intimate Partner Violence: Not on file     Allergies  Allergen Reactions   Horse Epithelium Anaphylaxis and Dermatitis   Horse-Derived Products Anaphylaxis    Reaction to contact with horses   Other Other (See Comments), Hives and Itching    Allergy to cats and some dogs - water  eyes - stuffy nose  Cat   Azithromycin Hives     And rapid heart rate  Other Reaction(s): chest pain-per patient   Lactose Intolerance (Gi) Other (See Comments)    constipation     Outpatient Medications Prior to Visit  Medication Sig Dispense Refill   albuterol  (VENTOLIN  HFA) 108 (90 Base) MCG/ACT inhaler Inhale 1-2 puffs into the lungs every 6 (six) hours as needed for wheezing or shortness of breath. 6.7 g 0   ALPRAZolam  (XANAX ) 0.5 MG tablet TAKE 1 TABLET BY MOUTH 3 TIMES DAILY AS NEEDED. 90 tablet 0   benzonatate  (TESSALON ) 100 MG capsule Take 1 capsule (100 mg total) by mouth 3 (three) times daily as needed for cough. 21 capsule 0   Biotin 2.5 MG CAPS Take 1 tablet by mouth daily at 2 PM.     budesonide -formoterol  (SYMBICORT ) 160-4.5 MCG/ACT inhaler Inhale 2 puffs into the lungs in the morning and at bedtime. 1 each 6   buPROPion  (WELLBUTRIN  XL) 300 MG 24 hr tablet Take 1 tablet (300 mg total) by mouth daily. 90 tablet 1   cetirizine (ZYRTEC) 10 MG tablet Take 10 mg by mouth daily.     cyclobenzaprine (FLEXERIL) 5 MG  tablet Take 5 mg by mouth 3 (three) times daily as needed.     desvenlafaxine  (PRISTIQ ) 100 MG 24 hr tablet Take 1 tablet (100 mg total) by mouth at bedtime. 90 tablet 3   Docosahexaenoic Acid (ALGAL OMEGA-3 DHA) 200 MG CAPS Take 1 g by mouth.     EPINEPHrine  0.3 mg/0.3 mL IJ SOAJ injection Inject 0.3 mg into the muscle once as needed (severe allergic reaction). 1 each 1   erythromycin ophthalmic ointment 1 Application 4 (four) times daily.     fluconazole  (DIFLUCAN ) 150 MG tablet Take 150 mg by mouth every 3 (three) days.     hydrOXYzine  (ATARAX ) 10 MG tablet Take 10 mg by mouth daily at 2 PM.     hydrOXYzine  (VISTARIL ) 50 MG capsule Take 1 capsule (50 mg total) by mouth at bedtime as needed for anxiety (insomnia; Panic attack). 90 capsule 3   ketorolac  (TORADOL ) 10 MG tablet Take 10 mg by mouth every 6 (six) hours as needed.     levalbuterol (XOPENEX) 1.25 MG/3ML nebulizer solution 3 ml Inhalation every 8 hrs for  30 days     levalbuterol (XOPENEX) 1.25 MG/3ML nebulizer solution Take 1.25 mg by nebulization every 8 (eight) hours as needed.     levonorgestrel  (MIRENA ) 20 MCG/24HR IUD 1 each by Intrauterine route once. Implanted November 2021     Lidocaine -Prilocaine (EMLA EX) Apply liberal amount of cream to vulva every three hours as needed.     lisdexamfetamine (VYVANSE ) 30 MG capsule Take 30 mg by mouth daily.     meloxicam (MOBIC) 15 MG tablet Take 15 mg by mouth daily as needed.     mirtazapine  (REMERON ) 30 MG tablet Take 30 mg by mouth at bedtime.     montelukast  (SINGULAIR ) 10 MG tablet Take 1 tablet (10 mg total) by mouth at bedtime. 30 tablet 11   norethindrone (AYGESTIN) 5 MG tablet Take 10 mg by mouth daily.     nystatin-triamcinolone ointment (MYCOLOG) Apply 1 Application topically 2 (two) times daily.     ondansetron  (ZOFRAN -ODT) 8 MG disintegrating tablet Take 8 mg by mouth every 8 (eight) hours as needed.     OVER THE COUNTER MEDICATION Take 1 tablet by mouth daily. Pre natal vitamin     oxyCODONE  (OXY IR/ROXICODONE ) 5 MG immediate release tablet Take 5 mg by mouth every 6 (six) hours as needed.     promethazine  (PHENERGAN ) 12.5 MG tablet Take 12.5 mg by mouth every 6 (six) hours as needed.     valACYclovir (VALTREX) 1000 MG tablet Take 1,000 mg by mouth daily.     No facility-administered medications prior to visit.    ROS Reviewed all systems and reported negative except as above     Objective:  There were no vitals filed for this visit.  Physical Exam     CBC    Component Value Date/Time   WBC 7.4 10/03/2023 0923   RBC 4.39 10/03/2023 0923   HGB 13.7 10/03/2023 0923   HCT 41.6 10/03/2023 0923   PLT 320 10/03/2023 0923   MCV 94.8 10/03/2023 0923   MCH 31.2 10/03/2023 0923   MCHC 32.9 10/03/2023 0923   RDW 13.1 10/03/2023 0923   LYMPHSABS 3,168 10/04/2017 1140   MONOABS 0.4 09/13/2015 1520   EOSABS 218 10/04/2017 1140   BASOSABS 47 10/04/2017 1140     Chest  imaging:  PFT:    Latest Ref Rng & Units 11/16/2021    1:09 PM  PFT Results  FVC-Pre  L 3.23   FVC-Predicted Pre % 88   FVC-Post L 3.34   FVC-Predicted Post % 91   Pre FEV1/FVC % % 71   Post FEV1/FCV % % 73   FEV1-Pre L 2.29   FEV1-Predicted Pre % 71   FEV1-Post L 2.43     Labs:    Echo:       Assessment & Plan:   There are no diagnoses linked to this encounter.  Assessment & Plan   .  Zola Herter, MD [facility] Office: [phone]

## 2024-05-10 NOTE — Patient Instructions (Signed)
 You were seen in the pulmonary clinic   - Please take Symbicort  2 puffs twice daily scheduled. Take 1-2 puffs Symbicort  every 6 hours as needed for asthma symptoms.   - If your symptoms don't improve with this regimen, please call the office, we can add a new inhaler to see if it helps with your symptoms.   - We will see you back in clinic in 3 months    - Reach out to the office early on if you feel you are having an exacerbation

## 2024-05-10 NOTE — Progress Notes (Signed)
 Subjective:   PATIENT ID: Ashley Chen GENDER: female DOB: Jun 29, 2001, MRN: [id number]   HPI 23 year old female with a past medical history of asthma (ACT today 10), currently on Symbicort  as a maintenance inhaler and albuterol  rescue.  Patient states that her asthma is not well-controlled.  She uses albuterol  on a daily basis.  She states that last month was a difficult month for her, she told her PCP that she was having more symptoms including cough and mucus production and was prescribed prednisone  which helped with her symptoms.  She reports that prior to this exacerbation she had another one in March.  She reports multiple allergies including dogs and cats.  Unfortunately she has 3 dogs and 1 cat that she is exposed to.  She also has a nebulizer machine which she barely uses except when she is very sick at home.  Reported history of vaping in the chart.  We did not discuss this during today's visit.   Past Medical History:  Diagnosis Date   Anxiety    Asthma    Depression    Migraine    PONV (postoperative nausea and vomiting)    Seasonal allergies      Family History  Problem Relation Age of Onset   Drug abuse Mother    Anxiety disorder Mother    ADD / ADHD Brother    Mental retardation Brother    Depression Paternal Uncle    Alcohol abuse Paternal Uncle      Social History   Socioeconomic History   Marital status: Single    Spouse name: Not on file   Number of children: Not on file   Years of education: 12   Highest education level: High school graduate  Occupational History   Not on file  Tobacco Use   Smoking status: Never   Smokeless tobacco: Current   Tobacco comments:    Vape ajp 05/10/2024  Vaping Use   Vaping status: Every Day   Substances: Nicotine, Flavoring  Substance and Sexual Activity   Alcohol use: Yes    Alcohol/week: 3.0 standard drinks of alcohol    Types: 3 Shots of liquor per week    Comment: once a month   Drug use: Yes     Types: Marijuana   Sexual activity: Yes    Partners: Male    Birth control/protection: I.U.D.  Other Topics Concern   Not on file  Social History Narrative   Lives with boyfriend in [place]. She is working as a Social worker currently.    Social Drivers of Corporate investment banker Strain: Not on file  Food Insecurity: Not on file  Transportation Needs: Not on file  Physical Activity: Not on file  Stress: Not on file  Social Connections: Not on file  Intimate Partner Violence: Not on file     Allergies  Allergen Reactions   Horse Epithelium Anaphylaxis and Dermatitis   Horse-Derived Products Anaphylaxis    Reaction to contact with horses   Other Other (See Comments), Hives and Itching    Allergy to cats and some dogs - water  eyes - stuffy nose  Cat   Azithromycin Hives    And rapid heart rate  Other Reaction(s): chest pain-per patient   Lactose Intolerance (Gi) Other (See Comments)    constipation     Outpatient Medications Prior to Visit  Medication Sig Dispense Refill   albuterol  (VENTOLIN  HFA) 108 (90 Base) MCG/ACT inhaler Inhale 1-2 puffs into the lungs every  6 (six) hours as needed for wheezing or shortness of breath. 6.7 g 0   ALPRAZolam  (XANAX ) 0.5 MG tablet TAKE 1 TABLET BY MOUTH 3 TIMES DAILY AS NEEDED. 90 tablet 0   buPROPion  (WELLBUTRIN  XL) 300 MG 24 hr tablet Take 1 tablet (300 mg total) by mouth daily. 90 tablet 1   cetirizine (ZYRTEC) 10 MG tablet Take 10 mg by mouth daily.     cyclobenzaprine (FLEXERIL) 5 MG tablet Take 5 mg by mouth 3 (three) times daily as needed.     desvenlafaxine  (PRISTIQ ) 100 MG 24 hr tablet Take 1 tablet (100 mg total) by mouth at bedtime. 90 tablet 3   EPINEPHrine  0.3 mg/0.3 mL IJ SOAJ injection Inject 0.3 mg into the muscle once as needed (severe allergic reaction). 1 each 1   fluconazole  (DIFLUCAN ) 150 MG tablet Take 150 mg by mouth every 3 (three) days.     hydrOXYzine  (VISTARIL ) 50 MG capsule Take 1 capsule (50 mg total) by  mouth at bedtime as needed for anxiety (insomnia; Panic attack). 90 capsule 3   levalbuterol (XOPENEX) 1.25 MG/3ML nebulizer solution 3 ml Inhalation every 8 hrs for 30 days     levalbuterol (XOPENEX) 1.25 MG/3ML nebulizer solution Take 1.25 mg by nebulization every 8 (eight) hours as needed.     levonorgestrel  (MIRENA ) 20 MCG/24HR IUD 1 each by Intrauterine route once. Implanted November 2021     ondansetron  (ZOFRAN -ODT) 8 MG disintegrating tablet Take 8 mg by mouth every 8 (eight) hours as needed.     valACYclovir (VALTREX) 1000 MG tablet Take 1,000 mg by mouth daily.     budesonide -formoterol  (SYMBICORT ) 160-4.5 MCG/ACT inhaler Inhale 2 puffs into the lungs in the morning and at bedtime. 1 each 6   benzonatate  (TESSALON ) 100 MG capsule Take 1 capsule (100 mg total) by mouth 3 (three) times daily as needed for cough. (Patient not taking: Reported on 05/10/2024) 21 capsule 0   Biotin 2.5 MG CAPS Take 1 tablet by mouth daily at 2 PM. (Patient not taking: Reported on 05/10/2024)     Docosahexaenoic Acid (ALGAL OMEGA-3 DHA) 200 MG CAPS Take 1 g by mouth.     erythromycin ophthalmic ointment 1 Application 4 (four) times daily. (Patient not taking: Reported on 05/10/2024)     hydrOXYzine  (ATARAX ) 10 MG tablet Take 10 mg by mouth daily at 2 PM. (Patient not taking: Reported on 05/10/2024)     ketorolac  (TORADOL ) 10 MG tablet Take 10 mg by mouth every 6 (six) hours as needed. (Patient not taking: Reported on 05/10/2024)     Lidocaine -Prilocaine (EMLA EX) Apply liberal amount of cream to vulva every three hours as needed.     lisdexamfetamine (VYVANSE ) 30 MG capsule Take 30 mg by mouth daily.     meloxicam (MOBIC) 15 MG tablet Take 15 mg by mouth daily as needed.     mirtazapine  (REMERON ) 30 MG tablet Take 30 mg by mouth at bedtime.     montelukast  (SINGULAIR ) 10 MG tablet Take 1 tablet (10 mg total) by mouth at bedtime. 30 tablet 11   norethindrone (AYGESTIN) 5 MG tablet Take 10 mg by mouth daily.      nystatin-triamcinolone ointment (MYCOLOG) Apply 1 Application topically 2 (two) times daily.     OVER THE COUNTER MEDICATION Take 1 tablet by mouth daily. Pre natal vitamin (Patient not taking: Reported on 05/10/2024)     oxyCODONE  (OXY IR/ROXICODONE ) 5 MG immediate release tablet Take 5 mg by mouth every 6 (six) hours  as needed.     promethazine  (PHENERGAN ) 12.5 MG tablet Take 12.5 mg by mouth every 6 (six) hours as needed. (Patient not taking: Reported on 05/10/2024)     No facility-administered medications prior to visit.    ROS Reviewed all systems and reported negative except as above     Objective:   Vitals:   05/10/24 1302  BP: 118/79  Pulse: (!) 111  Temp: 97.9 F (36.6 C)  TempSrc: Oral  SpO2: 97%  Weight: 132 lb 9.6 oz (60.1 kg)  Height: 5' 3 (1.6 m)        CBC    Component Value Date/Time   WBC 7.4 10/03/2023 0923   RBC 4.39 10/03/2023 0923   HGB 13.7 10/03/2023 0923   HCT 41.6 10/03/2023 0923   PLT 320 10/03/2023 0923   MCV 94.8 10/03/2023 0923   MCH 31.2 10/03/2023 0923   MCHC 32.9 10/03/2023 0923   RDW 13.1 10/03/2023 0923   LYMPHSABS 3,168 10/04/2017 1140   MONOABS 0.4 09/13/2015 1520   EOSABS 218 10/04/2017 1140   BASOSABS 47 10/04/2017 1140     Chest imaging:  PFT:    Latest Ref Rng & Units 11/16/2021    1:09 PM  PFT Results  FVC-Pre L 3.23   FVC-Predicted Pre % 88   FVC-Post L 3.34   FVC-Predicted Post % 91   Pre FEV1/FVC % % 71   Post FEV1/FCV % % 73   FEV1-Pre L 2.29   FEV1-Predicted Pre % 71   FEV1-Post L 2.43     Labs:    Echo:       Assessment & Plan:   Assessment & Plan Mild intermittent asthma, unspecified whether complicated Currently on Symbicort  scheduled maintenance and albuterol  rescue.  Symptoms uncontrolled and needing to use albuterol  almost every day.  Exposure to allergens probably not helping with her asthma control.  Plan to send an allergen panel.  Will also check CBC for eosinophils.  -Discussed  changing regimen today from Symbicort  and albuterol  rescue to Symbicort  scheduled as well as Symbicort  as needed.  Can consider adding Incruse or Spiriva in the future.  Should adding long-acting muscarinic antagonist not help with her symptoms we discussed potentially starting Biologics which she is interested in if her symptoms are not controlled.  Orders Placed This Encounter  Procedures   Allergen Panel (27) + IGE   CBC with Differential              Zola Herter, MD  Pulmonary & Critical Care Office: [phone]

## 2024-05-10 NOTE — Assessment & Plan Note (Signed)
 Currently on Symbicort  scheduled maintenance and albuterol  rescue.  Symptoms uncontrolled and needing to use albuterol  almost every day.  Exposure to allergens probably not helping with her asthma control.  Plan to send an allergen panel.  Will also check CBC for eosinophils.  -Discussed changing regimen today from Symbicort  and albuterol  rescue to Symbicort  scheduled as well as Symbicort  as needed.  Can consider adding Incruse or Spiriva in the future.  Should adding long-acting muscarinic antagonist not help with her symptoms we discussed potentially starting Biologics which she is interested in if her symptoms are not controlled.

## 2024-05-14 LAB — ALLERGEN PANEL (27) + IGE
Alternaria Alternata IgE: <0.1 kU/L
Aspergillus Fumigatus IgE: <0.1 kU/L
Bahia Grass IgE: <0.1 kU/L
Bermuda Grass IgE: <0.1 kU/L
Cat Dander IgE: <0.1 kU/L
Cedar, Mountain IgE: <0.1 kU/L
Cladosporium Herbarum IgE: <0.1 kU/L
Cocklebur IgE: <0.1 kU/L
Cockroach, American IgE: <0.1 kU/L
Common Silver Birch IgE: <0.1 kU/L
D Farinae IgE: <0.1 kU/L
D Pteronyssinus IgE: <0.1 kU/L
Dog Dander IgE: <0.1 kU/L
Elm, American IgE: <0.1 kU/L
Hickory, White IgE: <0.1 kU/L
IgE (Immunoglobulin E), Serum: 4 [IU]/mL — ABNORMAL LOW (ref 6–495)
Johnson Grass IgE: <0.1 kU/L
Kentucky Bluegrass IgE: <0.1 kU/L
Maple/Box Elder IgE: <0.1 kU/L
Mucor Racemosus IgE: <0.1 kU/L
Oak, White IgE: <0.1 kU/L
Penicillium Chrysogen IgE: <0.1 kU/L
Pigweed, Rough IgE: <0.1 kU/L
Plantain, English IgE: <0.1 kU/L
Ragweed, Short IgE: <0.1 kU/L
Setomelanomma Rostrat: <0.1 kU/L
Timothy Grass IgE: <0.1 kU/L
White Mulberry IgE: <0.1 kU/L

## 2024-05-25 ENCOUNTER — Encounter: Payer: Self-pay | Admitting: Behavioral Health

## 2024-05-25 ENCOUNTER — Ambulatory Visit: Admitting: Behavioral Health

## 2024-05-25 DIAGNOSIS — F902 Attention-deficit hyperactivity disorder, combined type: Secondary | ICD-10-CM | POA: Diagnosis not present

## 2024-05-25 DIAGNOSIS — F410 Panic disorder [episodic paroxysmal anxiety] without agoraphobia: Secondary | ICD-10-CM | POA: Diagnosis not present

## 2024-05-25 DIAGNOSIS — F411 Generalized anxiety disorder: Secondary | ICD-10-CM

## 2024-05-25 DIAGNOSIS — F330 Major depressive disorder, recurrent, mild: Secondary | ICD-10-CM

## 2024-05-25 DIAGNOSIS — F4312 Post-traumatic stress disorder, chronic: Secondary | ICD-10-CM | POA: Diagnosis not present

## 2024-05-25 MED ORDER — ALPRAZOLAM 0.5 MG PO TABS: 0.5000 mg | ORAL_TABLET | Freq: Three times a day (TID) | ORAL | 0 refills | Status: DC | PRN

## 2024-05-25 MED ORDER — BUPROPION HCL ER (XL) 300 MG PO TB24: 300.0000 mg | ORAL_TABLET | Freq: Every day | ORAL | 1 refills | Status: DC

## 2024-05-25 MED ORDER — HYDROXYZINE PAMOATE 50 MG PO CAPS: 50.0000 mg | ORAL_CAPSULE | Freq: Every evening | ORAL | 3 refills | Status: DC | PRN

## 2024-05-25 MED ORDER — DESVENLAFAXINE SUCCINATE ER 100 MG PO TB24: 100.0000 mg | ORAL_TABLET | Freq: Every day | ORAL | 3 refills | Status: DC

## 2024-05-25 NOTE — Progress Notes (Signed)
 [facility]  Patient ID: Ashley Chen,  MRN: [id number]  PCP: Verdia Lombard, MD  Date of Evaluation: 05/25/2024 Time spent:30 minutes  Chief Complaint:  Chief Complaint   Anxiety; Depression; Follow-up; Medication Refill; Patient Education     HISTORY/CURRENT STATUS: HPI 23 year old female presents to this office for follow up and medication management.  She is smiling and pleasant. Still engaged. Wedding next Oct 2026. Has experienced some asthma flair ups over the last couple of months. Doing well with anxiety and depression overall. Does not want to change or adjust her medications today but would like to follow up in 4 months. She has cut back on her vaping. Weight is steady,  Now 132 lbs.  She reports her anxiety today at 3/10 and depression at 2/10.  Is sleeping ok.  Reports no mania, no psychosis. Does not have SI or HI.    Prior Known Psychiatric medication trials: Prozac  Zoloft Wellbutrin  Mirtazapine  (Worked except for excessive weight gain) Klonopin  Buspar Prazosin  Intuniv  Concerta          Individual Medical History/ Review of Systems: Changes? :No   Allergies: Horse epithelium, Horse-derived products, Other, Azithromycin, and Lactose intolerance (gi)  Current Medications:  Current Outpatient Medications:    albuterol  (VENTOLIN  HFA) 108 (90 Base) MCG/ACT inhaler, Inhale 1-2 puffs into the lungs every 6 (six) hours as needed for wheezing or shortness of breath., Disp: 6.7 g, Rfl: 0   ALPRAZolam  (XANAX ) 0.5 MG tablet, Take 1 tablet (0.5 mg total) by mouth 3 (three) times daily as needed., Disp: 90 tablet, Rfl: 0   benzonatate  (TESSALON ) 100 MG capsule, Take 1 capsule (100 mg total) by mouth 3 (three) times daily as needed for cough. (Patient not taking: Reported on 05/10/2024), Disp: 21 capsule, Rfl: 0   Biotin 2.5 MG CAPS, Take 1 tablet by mouth daily at 2 PM. (Patient not taking: Reported on 05/10/2024), Disp: , Rfl:     budesonide -formoterol  (SYMBICORT ) 160-4.5 MCG/ACT inhaler, Inhale 2 puffs into the lungs in the morning and at bedtime. Please take 1-2 Puffs every 6 hours as needed for shortness of breath or worsening asthma symptoms. Do not exceed 8 puffs a day., Disp: 10.2 g, Rfl: 6   buPROPion  (WELLBUTRIN  XL) 300 MG 24 hr tablet, Take 1 tablet (300 mg total) by mouth daily., Disp: 90 tablet, Rfl: 1   cetirizine (ZYRTEC) 10 MG tablet, Take 10 mg by mouth daily., Disp: , Rfl:    cyclobenzaprine (FLEXERIL) 5 MG tablet, Take 5 mg by mouth 3 (three) times daily as needed., Disp: , Rfl:    desvenlafaxine  (PRISTIQ ) 100 MG 24 hr tablet, Take 1 tablet (100 mg total) by mouth at bedtime., Disp: 90 tablet, Rfl: 3   Docosahexaenoic Acid (ALGAL OMEGA-3 DHA) 200 MG CAPS, Take 1 g by mouth., Disp: , Rfl:    EPINEPHrine  0.3 mg/0.3 mL IJ SOAJ injection, Inject 0.3 mg into the muscle once as needed (severe allergic reaction)., Disp: 1 each, Rfl: 1   erythromycin ophthalmic ointment, 1 Application 4 (four) times daily. (Patient not taking: Reported on 05/10/2024), Disp: , Rfl:    fluconazole  (DIFLUCAN ) 150 MG tablet, Take 150 mg by mouth every 3 (three) days., Disp: , Rfl:    hydrOXYzine  (ATARAX ) 10 MG tablet, Take 10 mg by mouth daily at 2 PM. (Patient not taking: Reported on 05/10/2024), Disp: , Rfl:    hydrOXYzine  (VISTARIL ) 50 MG capsule, Take 1 capsule (50 mg total) by mouth at bedtime as needed for anxiety (  insomnia; Panic attack)., Disp: 90 capsule, Rfl: 3   ketorolac  (TORADOL ) 10 MG tablet, Take 10 mg by mouth every 6 (six) hours as needed. (Patient not taking: Reported on 05/10/2024), Disp: , Rfl:    levalbuterol (XOPENEX) 1.25 MG/3ML nebulizer solution, 3 ml Inhalation every 8 hrs for 30 days, Disp: , Rfl:    levalbuterol (XOPENEX) 1.25 MG/3ML nebulizer solution, Take 1.25 mg by nebulization every 8 (eight) hours as needed., Disp: , Rfl:    levonorgestrel  (MIRENA ) 20 MCG/24HR IUD, 1 each by Intrauterine route once. Implanted  November 2021, Disp: , Rfl:    Lidocaine -Prilocaine (EMLA EX), Apply liberal amount of cream to vulva every three hours as needed., Disp: , Rfl:    meloxicam (MOBIC) 15 MG tablet, Take 15 mg by mouth daily as needed., Disp: , Rfl:    montelukast  (SINGULAIR ) 10 MG tablet, Take 1 tablet (10 mg total) by mouth at bedtime., Disp: 30 tablet, Rfl: 11   norethindrone (AYGESTIN) 5 MG tablet, Take 10 mg by mouth daily., Disp: , Rfl:    nystatin-triamcinolone ointment (MYCOLOG), Apply 1 Application topically 2 (two) times daily., Disp: , Rfl:    ondansetron  (ZOFRAN -ODT) 8 MG disintegrating tablet, Take 8 mg by mouth every 8 (eight) hours as needed., Disp: , Rfl:    OVER THE COUNTER MEDICATION, Take 1 tablet by mouth daily. Pre natal vitamin (Patient not taking: Reported on 05/10/2024), Disp: , Rfl:    oxyCODONE  (OXY IR/ROXICODONE ) 5 MG immediate release tablet, Take 5 mg by mouth every 6 (six) hours as needed., Disp: , Rfl:    promethazine  (PHENERGAN ) 12.5 MG tablet, Take 12.5 mg by mouth every 6 (six) hours as needed. (Patient not taking: Reported on 05/10/2024), Disp: , Rfl:    valACYclovir (VALTREX) 1000 MG tablet, Take 1,000 mg by mouth daily., Disp: , Rfl:  Medication Side Effects: none  Family Medical/ Social History: Changes? No  MENTAL HEALTH EXAM:  There were no vitals taken for this visit.There is no height or weight on file to calculate BMI.  General Appearance: Casual, Neat, and Well Groomed  Eye Contact:  Good  Speech:  Clear and Coherent  Volume:  Normal  Mood:  NA  Affect:  Appropriate  Thought Process:  Coherent  Orientation:  Full (Time, Place, and Person)  Thought Content: Logical   Suicidal Thoughts:  No  Homicidal Thoughts:  No  Memory:  WNL  Judgement:  Good  Insight:  Good  Psychomotor Activity:  Normal  Concentration:  Concentration: Good  Recall:  Good  Fund of Knowledge: Good  Language: Good  Assets:  Desire for Improvement  ADL's:  Intact  Cognition: WNL   Prognosis:  Good    DIAGNOSES:    ICD-10-CM   1. Chronic post-traumatic stress disorder  F43.12 hydrOXYzine  (VISTARIL ) 50 MG capsule    buPROPion  (WELLBUTRIN  XL) 300 MG 24 hr tablet    desvenlafaxine  (PRISTIQ ) 100 MG 24 hr tablet    ALPRAZolam  (XANAX ) 0.5 MG tablet    2. Panic disorder  F41.0 hydrOXYzine  (VISTARIL ) 50 MG capsule    desvenlafaxine  (PRISTIQ ) 100 MG 24 hr tablet    ALPRAZolam  (XANAX ) 0.5 MG tablet    3. Attention deficit hyperactivity disorder (ADHD), combined type, moderate  F90.2 buPROPion  (WELLBUTRIN  XL) 300 MG 24 hr tablet    desvenlafaxine  (PRISTIQ ) 100 MG 24 hr tablet    4. 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 face to face time with patient was spent on counseling and coordination of care.  Calm and collected today. Happy with medications.  No changes to medication regimen recommended at this time. Refills escribed to patients pharmacy. Refills provided today PDMP reviewed.  [name], NP

## 2024-07-10 ENCOUNTER — Other Ambulatory Visit: Payer: Self-pay | Admitting: Medical Genetics

## 2024-07-10 DIAGNOSIS — Z006 Encounter for examination for normal comparison and control in clinical research program: Secondary | ICD-10-CM

## 2024-07-16 ENCOUNTER — Other Ambulatory Visit: Payer: Self-pay | Admitting: Behavioral Health

## 2024-07-16 DIAGNOSIS — F410 Panic disorder [episodic paroxysmal anxiety] without agoraphobia: Secondary | ICD-10-CM

## 2024-07-16 DIAGNOSIS — F4312 Post-traumatic stress disorder, chronic: Secondary | ICD-10-CM

## 2024-07-23 ENCOUNTER — Telehealth: Payer: Self-pay | Admitting: Internal Medicine

## 2024-07-23 ENCOUNTER — Ambulatory Visit: Payer: Self-pay

## 2024-07-23 MED ORDER — PREDNISONE 20 MG PO TABS: 20.0000 mg | ORAL_TABLET | Freq: Every day | ORAL | 0 refills | Status: DC

## 2024-07-23 NOTE — Telephone Encounter (Signed)
 Pt states she has had a continuance cough since last Friday. Nothing else. Woud like to know if dr will call in prednisone  and any other med /cough med he thinks appropriate.    LOV 04/2024-  May begin prednisone  20 mg daily for 5 days Delsym 2 teaspoons twice daily.  If symptoms are not improving will need office visit for further evaluation  Continue on Symbicort  2 puffs twice daily, rinse after use Albuterol  inhaler as needed  Please contact office for sooner follow up if symptoms do not improve or worsen or seek emergency care

## 2024-07-23 NOTE — Telephone Encounter (Signed)
 Copied from CRM #[id number]. Topic: Clinical - [name] Triage >> Jul 23, 2024  2:22 PM Nathanel DEL wrote: Ashley Chen triggered transfer to Nurse Triage. See Triage Message for details. Pt states she has had a continuance cough since last Friday.  Nothing else.  Woud like to know if dr will call in prednisone  and any other med /cough med he thinks appropriate.  [facility] - [place], [place] - [address]

## 2024-07-23 NOTE — Telephone Encounter (Signed)
 Please advise pt is requesting steroid rx

## 2024-07-23 NOTE — Telephone Encounter (Signed)
 E2C2 Pulmonary Triage - Initial Assessment Questions "Chief Complaint (e.g., cough, sob, wheezing, fever, chills, sweat or additional symptoms) *Go to specific symptom protocol after initial questions. Cough  "How long have symptoms been present?" Since Friday   Have you tested for COVID or Flu? Note: If not, ask patient if a home test can be taken. If so, instruct patient to call back for positive results. No  MEDICINES:   "Have you used any OTC meds to help with symptoms?" Yes If yes, ask "What medications?" Sudafed and benadryl  "Have you used your inhalers/maintenance medication?" Yes If yes, "What medications?"   If inhaler, ask "How many puffs and how often?" Note: Review instructions on medication in the chart. Albuterol  X 2 today   OXYGEN: "Do you wear supplemental oxygen?" No If yes, "How many liters are you supposed to use?"   "Do you monitor your oxygen levels?" Yes If yes, What is your reading (oxygen level) today? 97%  What is your usual oxygen saturation reading?  (Note: Pulmonary O2 sats should be 90% or greater) 90's   07/23/2024 - Patient Calls: Ashley Chen (Newest Message First)            View All Conversations on this Encounter   07/23/24  2:50 PM Ashley Chen routed this conversation to E2c2 Nurse Triage Pool - Pulmonology (Selected Message) Ashley Chen [facility]   07/23/24  2:48 PM Note Copied from CRM #[id number]. Topic: Clinical - [name] Triage >> Jul 23, 2024  2:22 PM Ashley Chen wrote: Ashley Chen triggered transfer to Nurse Triage. See Triage Message for details. Pt states she has had a continuance cough since last Friday.  Nothing else.  Woud like to know if dr will call in prednisone  and any other med /cough med he thinks appropriate.    [facility] - [place], [place] - [address]     Reason for Disposition  [1] MILD asthma attack (e.g., no SOB at rest, mild SOB with walking, speaks normally in sentences, mild  wheezing) AND [2] lasting > 24 hours on prescribed treatment  Answer Assessment - Initial Assessment Questions Additional info: Patient requesting prescription for steroid. Refused appointment. She stated pulmonologist said to call anytime she has a cough and feels she needs steroids and order will be sent for her. Patient would like to know if medication can be sent today, or will she be required an appointment?     1. RESPIRATORY STATUS: Describe your breathing? (e.g., wheezing, shortness of breath, unable to speak, severe coughing)      cough 2. ONSET: When did this asthma attack begin?      One week 3. TRIGGER: What do you think triggered this attack? (e.g., URI, exposure to pollen or other allergen, tobacco smoke)      Unsure thought she had upper respiratory infection-used sudafed and benadryl without change to her cough.     4. OTHER SYMPTOMS: Do you have any other symptoms? (e.g., chest pain, coughing up yellow sputum, fever, runny nose)     Denies. Speaking in full sentences on this call. No cough noted during call.  Protocols used: Asthma Attack-A-AH

## 2024-07-23 NOTE — Telephone Encounter (Signed)
 I called and spoke to pt. Pt informed of TP note and verbalized understanding.NFN

## 2024-07-23 NOTE — Telephone Encounter (Signed)
 Unable to convert telephone encounter to nurse triage encounter from patient calls folder. Signing to close. Please see nurse triage encounter for details.

## 2024-07-23 NOTE — Addendum Note (Signed)
 Addended by: ORLIE MADELIN RAMAN on: 07/23/2024 04:47 PM   Modules accepted: Orders

## 2024-08-10 ENCOUNTER — Ambulatory Visit (INDEPENDENT_AMBULATORY_CARE_PROVIDER_SITE_OTHER)

## 2024-08-10 VITALS — BP 110/66 | HR 116 | Ht 63.0 in | Wt 133.0 lb

## 2024-08-10 DIAGNOSIS — J4521 Mild intermittent asthma with (acute) exacerbation: Secondary | ICD-10-CM | POA: Diagnosis not present

## 2024-08-10 DIAGNOSIS — J4520 Mild intermittent asthma, uncomplicated: Secondary | ICD-10-CM

## 2024-08-10 DIAGNOSIS — J301 Allergic rhinitis due to pollen: Secondary | ICD-10-CM | POA: Diagnosis not present

## 2024-08-10 MED ORDER — IPRATROPIUM BROMIDE 0.06 % NA SOLN: 2.0000 | Freq: Four times a day (QID) | NASAL | 12 refills | Status: AC

## 2024-08-10 MED ORDER — SPIRIVA RESPIMAT 1.25 MCG/ACT IN AERS: 2.0000 | INHALATION_SPRAY | Freq: Every day | RESPIRATORY_TRACT | Status: DC

## 2024-08-10 NOTE — Progress Notes (Signed)
 Subjective:   PATIENT ID: Ashley Chen GENDER: female DOB: 05-20-2001, MRN: [id number]   HPI Discussed the use of AI scribe software for clinical note transcription with the patient, who gave verbal consent to proceed.  History of Present Illness Ashley Chen is a 23 year old female with asthma who presents for follow-up of her asthma management.  She experienced an exacerbation of her asthma at the end of October, managed with prednisone  prescribed by a nurse practitioner. She attributes the exacerbation to sinus issues that progressed to her chest, a pattern she has experienced before, often leading to pneumonia or bronchitis if not addressed. Since then, she has not had any further exacerbations.  She has been using Symbicort  as prescribed, two puffs in the morning and two at night, and reports a decrease in the use of albuterol . She reports that when she coughs, she is able to expectorate phlegm, which she finds beneficial. She occasionally skips doses of Symbicort  but has not needed to use the rescue Symbicort  frequently. Albuterol  causes her hands to shake and increases her heart rate significantly, a side effect she dislikes. Symbicort  causes less shakiness compared to albuterol .  The winter months are particularly challenging for her asthma due to the weather and her compromised immune system, which she attributes to her endometriosis. She is concerned about illnesses moving to her lungs and causing prolonged issues.  She has tried Flonase for postnasal drip but found it worsened her symptoms. She has not tried other treatments for nasal symptoms.     Past Medical History:  Diagnosis Date   Anxiety    Asthma    Depression    Migraine    PONV (postoperative nausea and vomiting)    Seasonal allergies      Family History  Problem Relation Age of Onset   Drug abuse Mother    Anxiety disorder Mother    ADD / ADHD Brother    Mental retardation Brother     Depression Paternal Uncle    Alcohol abuse Paternal Uncle      Social History   Socioeconomic History   Marital status: Single    Spouse name: Not on file   Number of children: Not on file   Years of education: 12   Highest education level: High school graduate  Occupational History   Not on file  Tobacco Use   Smoking status: Never   Smokeless tobacco: Current  Vaping Use   Vaping status: Every Day   Substances: Nicotine, Flavoring  Substance and Sexual Activity   Alcohol use: Yes    Alcohol/week: 3.0 standard drinks of alcohol    Types: 3 Shots of liquor per week    Comment: once a month   Drug use: Yes    Types: Marijuana   Sexual activity: Yes    Partners: Male    Birth control/protection: I.U.D.  Other Topics Concern   Not on file  Social History Narrative   Lives with boyfriend in [place]. She is working as a Social Worker currently.    Social Drivers of Corporate Investment Banker Strain: Not on file  Food Insecurity: Not on file  Transportation Needs: Not on file  Physical Activity: Not on file  Stress: Not on file  Social Connections: Not on file  Intimate Partner Violence: Not on file     Allergies  Allergen Reactions   Horse Epithelium Anaphylaxis and Dermatitis   Horse-Derived Products Anaphylaxis    Reaction to contact  with horses   Other Other (See Comments), Hives and Itching    Allergy to cats and some dogs - water  eyes - stuffy nose  Cat   Azithromycin Hives    And rapid heart rate  Other Reaction(s): chest pain-per patient   Lactose Intolerance (Gi) Other (See Comments)    constipation     Outpatient Medications Prior to Visit  Medication Sig Dispense Refill   albuterol  (VENTOLIN  HFA) 108 (90 Base) MCG/ACT inhaler Inhale 1-2 puffs into the lungs every 6 (six) hours as needed for wheezing or shortness of breath. 6.7 g 0   ALPRAZolam  (XANAX ) 0.5 MG tablet TAKE 1 TABLET BY MOUTH 3 TIMES DAILY AS NEEDED. 90 tablet 1   budesonide -formoterol   (SYMBICORT ) 160-4.5 MCG/ACT inhaler Inhale 2 puffs into the lungs in the morning and at bedtime. Please take 1-2 Puffs every 6 hours as needed for shortness of breath or worsening asthma symptoms. Do not exceed 8 puffs a day. 10.2 g 6   buPROPion  (WELLBUTRIN  XL) 300 MG 24 hr tablet Take 1 tablet (300 mg total) by mouth daily. 90 tablet 1   cetirizine (ZYRTEC) 10 MG tablet Take 10 mg by mouth daily.     cyclobenzaprine (FLEXERIL) 5 MG tablet Take 5 mg by mouth 3 (three) times daily as needed.     desvenlafaxine  (PRISTIQ ) 100 MG 24 hr tablet Take 1 tablet (100 mg total) by mouth at bedtime. 90 tablet 3   EPINEPHrine  0.3 mg/0.3 mL IJ SOAJ injection Inject 0.3 mg into the muscle once as needed (severe allergic reaction). 1 each 1   hydrOXYzine  (VISTARIL ) 50 MG capsule Take 1 capsule (50 mg total) by mouth at bedtime as needed for anxiety (insomnia; Panic attack). 90 capsule 3   levalbuterol (XOPENEX) 1.25 MG/3ML nebulizer solution Take 1.25 mg by nebulization every 8 (eight) hours as needed.     levonorgestrel  (MIRENA ) 20 MCG/24HR IUD 1 each by Intrauterine route once. Implanted November 2021     montelukast  (SINGULAIR ) 10 MG tablet Take 1 tablet (10 mg total) by mouth at bedtime. 30 tablet 11   valACYclovir (VALTREX) 1000 MG tablet Take 1,000 mg by mouth daily.     hydrOXYzine  (ATARAX ) 10 MG tablet Take 10 mg by mouth daily at 2 PM. (Patient not taking: Reported on 05/10/2024)     benzonatate  (TESSALON ) 100 MG capsule Take 1 capsule (100 mg total) by mouth 3 (three) times daily as needed for cough. (Patient not taking: Reported on 05/10/2024) 21 capsule 0   Biotin 2.5 MG CAPS Take 1 tablet by mouth daily at 2 PM. (Patient not taking: Reported on 05/10/2024)     Docosahexaenoic Acid (ALGAL OMEGA-3 DHA) 200 MG CAPS Take 1 g by mouth.     erythromycin ophthalmic ointment 1 Application 4 (four) times daily. (Patient not taking: Reported on 05/10/2024)     fluconazole  (DIFLUCAN ) 150 MG tablet Take 150 mg by  mouth every 3 (three) days.     ketorolac  (TORADOL ) 10 MG tablet Take 10 mg by mouth every 6 (six) hours as needed. (Patient not taking: Reported on 05/10/2024)     levalbuterol (XOPENEX) 1.25 MG/3ML nebulizer solution 3 ml Inhalation every 8 hrs for 30 days     Lidocaine -Prilocaine (EMLA EX) Apply liberal amount of cream to vulva every three hours as needed.     meloxicam (MOBIC) 15 MG tablet Take 15 mg by mouth daily as needed.     norethindrone (AYGESTIN) 5 MG tablet Take 10 mg by mouth  daily.     nystatin-triamcinolone ointment (MYCOLOG) Apply 1 Application topically 2 (two) times daily.     ondansetron  (ZOFRAN -ODT) 8 MG disintegrating tablet Take 8 mg by mouth every 8 (eight) hours as needed.     OVER THE COUNTER MEDICATION Take 1 tablet by mouth daily. Pre natal vitamin (Patient not taking: Reported on 05/10/2024)     oxyCODONE  (OXY IR/ROXICODONE ) 5 MG immediate release tablet Take 5 mg by mouth every 6 (six) hours as needed.     predniSONE  (DELTASONE ) 20 MG tablet Take 1 tablet (20 mg total) by mouth daily with breakfast. 5 tablet 0   promethazine  (PHENERGAN ) 12.5 MG tablet Take 12.5 mg by mouth every 6 (six) hours as needed. (Patient not taking: Reported on 05/10/2024)     No facility-administered medications prior to visit.    ROS Reviewed all systems and reported negative except as above     Objective:   Vitals:   08/10/24 1137  BP: 110/66  Pulse: (!) 116  SpO2: 96%  Weight: 133 lb (60.3 kg)  Height: 5' 3 (1.6 m)    Physical Exam Physical Exam GENERAL: Appropriate to age, no acute distress. HEAD EYES EARS NOSE THROAT: Moist mucous membranes, atraumatic, normocephalic. CHEST: Clear to auscultation bilaterally, no wheezing, no crackles, no rales. CARDIAC: Regular rate and rhythm, normal S1, normal S2, no murmurs, no rubs, no gallops. ABDOMEN: Soft, nontender. NEUROLOGICAL: Motor and sensation grossly intact, alert and oriented times X 3. EXTREMITIES: Warm, well perfused,  no edema.     CBC    Component Value Date/Time   WBC 7.4 10/03/2023 0923   RBC 4.39 10/03/2023 0923   HGB 13.7 10/03/2023 0923   HCT 41.6 10/03/2023 0923   PLT 320 10/03/2023 0923   MCV 94.8 10/03/2023 0923   MCH 31.2 10/03/2023 0923   MCHC 32.9 10/03/2023 0923   RDW 13.1 10/03/2023 0923   LYMPHSABS 3,168 10/04/2017 1140   MONOABS 0.4 09/13/2015 1520   EOSABS 218 10/04/2017 1140   BASOSABS 47 10/04/2017 1140      PFT:    Latest Ref Rng & Units 11/16/2021    1:09 PM  PFT Results  FVC-Pre L 3.23   FVC-Predicted Pre % 88   FVC-Post L 3.34   FVC-Predicted Post % 91   Pre FEV1/FVC % % 71   Post FEV1/FCV % % 73   FEV1-Pre L 2.29   FEV1-Predicted Pre % 71   FEV1-Post L 2.43      Assessment & Plan:   Assessment and Plan Assessment & Plan Mild intermittent asthma with recent exacerbation Recent exacerbation likely due to sinus issues. Improved symptoms with Symbicort . Considering therapy escalation due to winter exacerbations and immune compromise from endometriosis. Discussed adding Spiriva for secretion management. Biologics considered if needed. - Provided two Spiriva 1.25 samples for trial. - Instructed to add Spiriva to morning Symbicort , two puffs once daily. - Call for prescription if Spiriva effective. - Prescribe prednisone  for exacerbations as needed. - Consider triple inhaler if Spiriva effective. - Discuss biologics if current regimen fails.  Postnasal drip Contributing to asthma symptoms. Flonase ineffective. Discussed ipratropium nasal spray for management, cautioning against excessive dryness. - Prescribed ipratropium nasal spray, one spray three times daily. - Adjust dosage based on symptoms, max two sprays four times daily. - Sent prescription to pharmacy.        Zola Herter, MD [facility] Office: [phone]

## 2024-08-10 NOTE — Patient Instructions (Addendum)
  VISIT SUMMARY: You came in today for a follow-up on your asthma management. You had an asthma flare-up at the end of October, which was managed with prednisone . Since then, you have been using Symbicort  regularly and have noticed a decrease in the need for albuterol . You also mentioned that winter months are particularly challenging for your asthma due to the weather and your compromised immune system from endometriosis. We discussed additional treatments to help manage your symptoms.  YOUR PLAN: -MILD INTERMITTENT ASTHMA WITH RECENT EXACERBATION: Mild intermittent asthma means you have occasional asthma symptoms. Your recent flare-up was likely due to sinus issues. We discussed adding Spiriva to your treatment plan to help manage secretions. You should take two puffs of Spiriva once daily in the morning along with your Symbicort . If Spiriva works well for you, call us  for a prescription. We also prescribed prednisone  to use during flare-ups. If needed, we may consider a triple inhaler or biologics in the future.  -POSTNASAL DRIP: Postnasal drip is when mucus from your nose drips down the back of your throat, which can worsen asthma symptoms. Since Flonase was not effective for you, we prescribed ipratropium nasal spray. Use one spray three times daily, and you can adjust the dosage based on your symptoms, up to a maximum of two sprays four times daily. The prescription has been sent to your pharmacy.  INSTRUCTIONS: Please call us  if Spiriva is effective so we can provide a prescription. Use prednisone  as needed for asthma flare-ups. Follow the dosage instructions for ipratropium nasal spray and adjust as necessary. If your current treatment plan does not work, we may discuss other options like a triple inhaler or biologics.                          Contains text generated by [employer].

## 2024-08-10 NOTE — Progress Notes (Signed)
 Subjective:   PATIENT ID: Ashley Chen GENDER: female DOB: 2001/02/22, MRN: [id number]   HPI Discussed the use of AI scribe software for clinical note transcription with the patient, who gave verbal consent to proceed.  History of Present Illness      Past Medical History:  Diagnosis Date   Anxiety    Asthma    Depression    Migraine    PONV (postoperative nausea and vomiting)    Seasonal allergies      Family History  Problem Relation Age of Onset   Drug abuse Mother    Anxiety disorder Mother    ADD / ADHD Brother    Mental retardation Brother    Depression Paternal Uncle    Alcohol abuse Paternal Uncle      Social History   Socioeconomic History   Marital status: Single    Spouse name: Not on file   Number of children: Not on file   Years of education: 12   Highest education level: High school graduate  Occupational History   Not on file  Tobacco Use   Smoking status: Never   Smokeless tobacco: Current  Vaping Use   Vaping status: Every Day   Substances: Nicotine, Flavoring  Substance and Sexual Activity   Alcohol use: Yes    Alcohol/week: 3.0 standard drinks of alcohol    Types: 3 Shots of liquor per week    Comment: once a month   Drug use: Yes    Types: Marijuana   Sexual activity: Yes    Partners: Male    Birth control/protection: I.U.D.  Other Topics Concern   Not on file  Social History Narrative   Lives with boyfriend in [place]. She is working as a Social Worker currently.    Social Drivers of Corporate Investment Banker Strain: Not on file  Food Insecurity: Not on file  Transportation Needs: Not on file  Physical Activity: Not on file  Stress: Not on file  Social Connections: Not on file  Intimate Partner Violence: Not on file     Allergies  Allergen Reactions   Horse Epithelium Anaphylaxis and Dermatitis   Horse-Derived Products Anaphylaxis    Reaction to contact with horses   Other Other (See Comments), Hives and  Itching    Allergy to cats and some dogs - water  eyes - stuffy nose  Cat   Azithromycin Hives    And rapid heart rate  Other Reaction(s): chest pain-per patient   Lactose Intolerance (Gi) Other (See Comments)    constipation     Outpatient Medications Prior to Visit  Medication Sig Dispense Refill   albuterol  (VENTOLIN  HFA) 108 (90 Base) MCG/ACT inhaler Inhale 1-2 puffs into the lungs every 6 (six) hours as needed for wheezing or shortness of breath. 6.7 g 0   ALPRAZolam  (XANAX ) 0.5 MG tablet TAKE 1 TABLET BY MOUTH 3 TIMES DAILY AS NEEDED. 90 tablet 1   budesonide -formoterol  (SYMBICORT ) 160-4.5 MCG/ACT inhaler Inhale 2 puffs into the lungs in the morning and at bedtime. Please take 1-2 Puffs every 6 hours as needed for shortness of breath or worsening asthma symptoms. Do not exceed 8 puffs a day. 10.2 g 6   buPROPion  (WELLBUTRIN  XL) 300 MG 24 hr tablet Take 1 tablet (300 mg total) by mouth daily. 90 tablet 1   cetirizine (ZYRTEC) 10 MG tablet Take 10 mg by mouth daily.     cyclobenzaprine (FLEXERIL) 5 MG tablet Take 5 mg by mouth 3 (  three) times daily as needed.     desvenlafaxine  (PRISTIQ ) 100 MG 24 hr tablet Take 1 tablet (100 mg total) by mouth at bedtime. 90 tablet 3   EPINEPHrine  0.3 mg/0.3 mL IJ SOAJ injection Inject 0.3 mg into the muscle once as needed (severe allergic reaction). 1 each 1   hydrOXYzine  (VISTARIL ) 50 MG capsule Take 1 capsule (50 mg total) by mouth at bedtime as needed for anxiety (insomnia; Panic attack). 90 capsule 3   levalbuterol (XOPENEX) 1.25 MG/3ML nebulizer solution Take 1.25 mg by nebulization every 8 (eight) hours as needed.     levonorgestrel  (MIRENA ) 20 MCG/24HR IUD 1 each by Intrauterine route once. Implanted November 2021     montelukast  (SINGULAIR ) 10 MG tablet Take 1 tablet (10 mg total) by mouth at bedtime. 30 tablet 11   valACYclovir (VALTREX) 1000 MG tablet Take 1,000 mg by mouth daily.     hydrOXYzine  (ATARAX ) 10 MG tablet Take 10 mg by mouth  daily at 2 PM. (Patient not taking: Reported on 05/10/2024)     benzonatate  (TESSALON ) 100 MG capsule Take 1 capsule (100 mg total) by mouth 3 (three) times daily as needed for cough. (Patient not taking: Reported on 05/10/2024) 21 capsule 0   Biotin 2.5 MG CAPS Take 1 tablet by mouth daily at 2 PM. (Patient not taking: Reported on 05/10/2024)     Docosahexaenoic Acid (ALGAL OMEGA-3 DHA) 200 MG CAPS Take 1 g by mouth.     erythromycin ophthalmic ointment 1 Application 4 (four) times daily. (Patient not taking: Reported on 05/10/2024)     fluconazole  (DIFLUCAN ) 150 MG tablet Take 150 mg by mouth every 3 (three) days.     ketorolac  (TORADOL ) 10 MG tablet Take 10 mg by mouth every 6 (six) hours as needed. (Patient not taking: Reported on 05/10/2024)     levalbuterol (XOPENEX) 1.25 MG/3ML nebulizer solution 3 ml Inhalation every 8 hrs for 30 days     Lidocaine -Prilocaine (EMLA EX) Apply liberal amount of cream to vulva every three hours as needed.     meloxicam (MOBIC) 15 MG tablet Take 15 mg by mouth daily as needed.     norethindrone (AYGESTIN) 5 MG tablet Take 10 mg by mouth daily.     nystatin-triamcinolone ointment (MYCOLOG) Apply 1 Application topically 2 (two) times daily.     ondansetron  (ZOFRAN -ODT) 8 MG disintegrating tablet Take 8 mg by mouth every 8 (eight) hours as needed.     OVER THE COUNTER MEDICATION Take 1 tablet by mouth daily. Pre natal vitamin (Patient not taking: Reported on 05/10/2024)     oxyCODONE  (OXY IR/ROXICODONE ) 5 MG immediate release tablet Take 5 mg by mouth every 6 (six) hours as needed.     predniSONE  (DELTASONE ) 20 MG tablet Take 1 tablet (20 mg total) by mouth daily with breakfast. 5 tablet 0   promethazine  (PHENERGAN ) 12.5 MG tablet Take 12.5 mg by mouth every 6 (six) hours as needed. (Patient not taking: Reported on 05/10/2024)     No facility-administered medications prior to visit.    ROS Reviewed all systems and reported negative except as above     Objective:    Vitals:   08/10/24 1137  BP: 110/66  Pulse: (!) 116  SpO2: 96%  Weight: 133 lb (60.3 kg)  Height: 5' 3 (1.6 m)    Physical Exam Physical Exam      CBC    Component Value Date/Time   WBC 7.4 10/03/2023 0923   RBC 4.39 10/03/2023 0923  HGB 13.7 10/03/2023 0923   HCT 41.6 10/03/2023 0923   PLT 320 10/03/2023 0923   MCV 94.8 10/03/2023 0923   MCH 31.2 10/03/2023 0923   MCHC 32.9 10/03/2023 0923   RDW 13.1 10/03/2023 0923   LYMPHSABS 3,168 10/04/2017 1140   MONOABS 0.4 09/13/2015 1520   EOSABS 218 10/04/2017 1140   BASOSABS 47 10/04/2017 1140     Chest imaging:  PFT:    Latest Ref Rng & Units 11/16/2021    1:09 PM  PFT Results  FVC-Pre L 3.23   FVC-Predicted Pre % 88   FVC-Post L 3.34   FVC-Predicted Post % 91   Pre FEV1/FVC % % 71   Post FEV1/FCV % % 73   FEV1-Pre L 2.29   FEV1-Predicted Pre % 71   FEV1-Post L 2.43     Labs:    Echo:       Assessment & Plan:   Assessment and Plan Assessment & Plan         Zola Herter, MD [facility] Office: [phone]

## 2024-08-14 ENCOUNTER — Ambulatory Visit (INDEPENDENT_AMBULATORY_CARE_PROVIDER_SITE_OTHER): Payer: Self-pay | Admitting: Podiatry

## 2024-08-14 ENCOUNTER — Encounter: Payer: Self-pay | Admitting: Podiatry

## 2024-08-14 VITALS — Ht 63.0 in | Wt 133.0 lb

## 2024-08-14 DIAGNOSIS — D2372 Other benign neoplasm of skin of left lower limb, including hip: Secondary | ICD-10-CM

## 2024-08-14 NOTE — Progress Notes (Signed)
   Chief Complaint  Patient presents with   Plantar Warts    Pt is here due to plantar wart on the heel of the left foot, she states she is not sure how long it has been there, but hurts to walk on.    Subjective: 23 y.o. female presenting to the office today for evaluation of a symptomatic skin lesion to the plantar aspect of the left heel.  She does have a history of plantar warts   Past Medical History:  Diagnosis Date   Anxiety    Asthma    Depression    Migraine    PONV (postoperative nausea and vomiting)    Seasonal allergies     Past Surgical History:  Procedure Laterality Date   FOOT SURGERY     labial surgery     LAPAROSCOPY N/A 10/03/2023   Procedure: LAPAROSCOPY DIAGNOSTIC excision endometiotic implant;  Surgeon: Henry Slough, MD;  Location: [facility];  Service: Gynecology;  Laterality: N/A;   TONSILECTOMY, ADENOIDECTOMY, BILATERAL MYRINGOTOMY AND TUBES     before age 63   TONSILLECTOMY AND ADENOIDECTOMY     umblical hernia repair     before age 9    Allergies  Allergen Reactions   Horse Epithelium Anaphylaxis and Dermatitis   Horse-Derived Products Anaphylaxis    Reaction to contact with horses   Other Other (See Comments), Hives and Itching    Allergy to cats and some dogs - water  eyes - stuffy nose  Cat   Azithromycin Hives    And rapid heart rate  Other Reaction(s): chest pain-per patient   Lactose Intolerance (Gi) Other (See Comments)    constipation     Objective:  Physical Exam General: Alert and oriented x3 in no acute distress  Dermatology: Hyperkeratotic lesion(s) present on the plantar aspect of the left heel. Pain on palpation with a central nucleated core noted. Skin is warm, dry and supple bilateral lower extremities. Negative for open lesions or macerations.  Vascular: Palpable pedal pulses bilaterally. No edema or erythema noted. Capillary refill within normal limits.  Neurological: Grossly intact via light  touch  Musculoskeletal Exam: Pain on palpation at the keratotic lesion(s) noted. Range of motion within normal limits bilateral. Muscle strength 5/5 in all groups bilateral.  Assessment: 1.  Eccrine poroma left plantar heel   Plan of Care:  -Patient evaluated -Excisional debridement of keratoic lesion(s) using a chisel blade was performed without incident.  -Salicylic acid applied with a bandaid -Recommend salicylic acid daily x 4 weeks as tolerated under occlusion with a Band-Aid -Return to the clinic 4 weeks  Thresa EMERSON Sar, DPM [facility]  Dr. Thresa EMERSON Sar, DPM    [address] [place], [place] [address]                Office [phone]  Fax [phone]

## 2024-08-20 LAB — GENECONNECT MOLECULAR SCREEN: Genetic Analysis Overall Interpretation: NEGATIVE

## 2024-08-21 ENCOUNTER — Ambulatory Visit: Admission: EM | Admit: 2024-08-21 | Discharge: 2024-08-21 | Disposition: A | Discharge status: Home / Self Care

## 2024-08-21 ENCOUNTER — Encounter: Payer: Self-pay | Admitting: Emergency Medicine

## 2024-08-21 DIAGNOSIS — J45901 Unspecified asthma with (acute) exacerbation: Secondary | ICD-10-CM

## 2024-08-21 DIAGNOSIS — F40231 Fear of injections and transfusions: Secondary | ICD-10-CM | POA: Insufficient documentation

## 2024-08-21 MED ORDER — GUAIFENESIN ER 600 MG PO TB12: 600.0000 mg | ORAL_TABLET | Freq: Two times a day (BID) | ORAL | 0 refills | Status: AC

## 2024-08-21 MED ORDER — METHYLPREDNISOLONE 4 MG PO TBPK: ORAL_TABLET | ORAL | 0 refills | Status: AC

## 2024-08-21 MED ORDER — PROMETHAZINE-DM 6.25-15 MG/5ML PO SYRP: 5.0000 mL | ORAL_SOLUTION | Freq: Four times a day (QID) | ORAL | 0 refills | Status: DC | PRN

## 2024-08-21 NOTE — ED Provider Notes (Signed)
 [facility]    CSN: [id number] Arrival date & time: 08/21/24  9040      History   Chief Complaint Chief Complaint  Patient presents with   Cough    HPI Ashley Chen is a 23 y.o. female.   Patient with a history of asthma and vape use, presents today due to persistent coughing that is productive of whitish to clear sputum for the past 4 days.  Patient states that she is using her maintenance inhaler and her rescue inhaler with no significant relief.  Patient states that she experience nothing similar to this once a year.  Patient denies fever, chills, nausea, vomiting, and states that she is eating and drinking normally.  The history is provided by the patient.  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   Migraine without status migrainosus, not intractable 03/04/2024   Mild intermittent asthma with exacerbation 03/04/2024   Mild persistent asthma with exacerbation 03/04/2024   Other chronic pain 03/04/2024   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/2024   Seasonal allergies 03/04/2024   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   Anxiety disorder 03/04/2024   Panic disorder 09/06/2018   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   Acute pharyngitis 09/04/2018   Diarrhea 09/04/2018   Nausea and vomiting 09/04/2018   Viral infection of digestive tract 09/04/2018   Attention deficit hyperactivity disorder (ADHD), combined type, 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   Pneumonia 11/01/2017   Moderate malnutrition 10/12/2017   Needle phobia 10/12/2017   Chronic post-traumatic stress disorder  06/11/2015   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   Menorrhagia 08/14/2014    Past Surgical History:  Procedure Laterality Date   FOOT SURGERY     labial surgery     LAPAROSCOPY N/A 10/03/2023   Procedure: LAPAROSCOPY DIAGNOSTIC excision endometiotic implant;  Surgeon: Henry Slough, MD;  Location: [facility];  Service: Gynecology;  Laterality: N/A;   TONSILECTOMY, ADENOIDECTOMY, BILATERAL MYRINGOTOMY AND TUBES     before age 71   TONSILLECTOMY AND ADENOIDECTOMY     umblical hernia repair     before age 19    OB History     Gravida  0   Para  0   Term  0   Preterm  0   AB  0   Living  0      SAB  0   IAB  0   Ectopic  0   Multiple  0   Live Births  0            Home Medications    Prior to Admission medications   Medication Sig Start Date End Date Taking? Authorizing Provider  amoxicillin -clavulanate (AUGMENTIN) 500-125 MG tablet 1 tablet Orally every 12 hrs; Duration: 7 days 04/06/24  Yes [provider]  azithromycin (ZITHROMAX) 500 MG tablet Oral 09/04/18  Yes [provider]  citalopram (CELEXA) 10 MG tablet Oral 09/04/18  Yes [provider]  clonazePAM  (KLONOPIN ) 0.5 MG tablet Oral 09/04/18  Yes [provider]  erythromycin ophthalmic ointment 1 application into the lower  eyelid of affected eye Ophthalmic Four times a day; Duration: 7 days 05/09/23  Yes [provider]  guaiFENesin  (MUCINEX ) 600 MG 12 hr tablet Take 1 tablet (600 mg total) by mouth 2 (two) times daily for 10 days. 08/21/24 08/31/24 Yes Andra Corean BROCKS, PA-C  levofloxacin (LEVAQUIN) 500 MG tablet Oral 09/04/18  Yes [provider]  lisdexamfetamine (VYVANSE ) 30 MG capsule Oral 09/04/18  Yes [provider]  LORazepam  (ATIVAN ) 0.5 MG tablet Oral 09/04/18  Yes [provider]  methylPREDNISolone  (MEDROL  DOSEPAK) 4 MG TBPK tablet Take as directed on back of package 08/21/24  Yes Andra Corean BROCKS,  PA-C  ondansetron  (ZOFRAN -ODT) 4 MG disintegrating tablet Oral 09/04/18  Yes [provider]  promethazine -dextromethorphan (PROMETHAZINE -DM) 6.25-15 MG/5ML syrup Take 5 mLs by mouth 4 (four) times daily as needed for cough. 08/21/24  Yes Andra Corean BROCKS, PA-C  albuterol  (VENTOLIN  HFA) 108 (90 Base) MCG/ACT inhaler Inhale 1-2 puffs into the lungs every 6 (six) hours as needed for wheezing or shortness of breath. 03/27/24   Romelle Booty, MD  ALPRAZolam  (XANAX ) 0.5 MG tablet TAKE 1 TABLET BY MOUTH 3 TIMES DAILY AS NEEDED. 07/17/24   Rhys Boyer T, PA-C  budesonide -formoterol  (SYMBICORT ) 160-4.5 MCG/ACT inhaler Inhale 2 puffs into the lungs in the morning and at bedtime. Please take 1-2 Puffs every 6 hours as needed for shortness of breath or worsening asthma symptoms. Do not exceed 8 puffs a day. 05/10/24   Hattar, Zola SAILOR, MD  buPROPion  (WELLBUTRIN  XL) 300 MG 24 hr tablet Take 1 tablet (300 mg total) by mouth daily. 05/25/24   Teresa Redell LABOR, NP  cetirizine (ZYRTEC) 10 MG tablet Take 10 mg by mouth daily. 08/14/14   [provider]  cyclobenzaprine (FLEXERIL) 5 MG tablet Take 5 mg by mouth 3 (three) times daily as needed. 03/12/24   [provider]  desvenlafaxine  (PRISTIQ ) 100 MG 24 hr tablet Take 1 tablet (100 mg total) by mouth at bedtime. 05/25/24   Teresa Redell LABOR, NP  EPINEPHrine  0.3 mg/0.3 mL IJ SOAJ injection Inject 0.3 mg into the muscle once as needed (severe allergic reaction). 01/19/22   Teresa Redell LABOR, NP  hydrOXYzine  (ATARAX ) 10 MG tablet Take 10 mg by mouth daily at 2 PM.    [provider]  hydrOXYzine  (VISTARIL ) 50 MG capsule Take 1 capsule (50 mg total) by mouth at bedtime as needed for anxiety (insomnia; Panic attack). 05/25/24   Teresa Redell A, NP  ipratropium (ATROVENT ) 0.06 % nasal spray Place 2 sprays into both nostrils 4 (four) times daily. 08/10/24   Hattar, Zola SAILOR, MD  levalbuterol (XOPENEX) 1.25 MG/3ML nebulizer solution Take 1.25 mg by  nebulization every 8 (eight) hours as needed. 08/20/19   [provider]  levonorgestrel  (MIRENA ) 20 MCG/24HR IUD 1 each by Intrauterine route once. Implanted November 2021    [provider]  montelukast  (SINGULAIR ) 10 MG tablet Take 1 tablet (10 mg total) by mouth at bedtime. 11/16/21   Desai, Nikita S, MD  Tiotropium Bromide (SPIRIVA  RESPIMAT) 1.25 MCG/ACT AERS Inhale 2 puffs into the lungs daily. 08/10/24   Hattar, Zola SAILOR, MD  valACYclovir (VALTREX) 1000 MG tablet Take 1,000 mg by mouth daily.    [provider]    Family History Family History  Problem Relation Age of Onset   Drug abuse Mother    Anxiety disorder Mother    ADD / ADHD Brother    Mental retardation Brother    Depression Paternal Uncle  Alcohol abuse Paternal Uncle     Social History Social History   Tobacco Use   Smoking status: Every Day    Types: Cigarettes    Passive exposure: Never   Smokeless tobacco: Never  Vaping Use   Vaping status: Every Day   Substances: Nicotine, Flavoring  Substance Use Topics   Alcohol use: Yes    Alcohol/week: 3.0 standard drinks of alcohol    Types: 3 Shots of liquor per week    Comment: once a month   Drug use: Yes    Types: Marijuana     Allergies   Horse epithelium, Horse-derived products, Other, Azithromycin, and Lactose intolerance (gi)   Review of Systems Review of Systems  Respiratory:  Positive for cough.      Physical Exam Triage Vital Signs ED Triage Vitals  Encounter Vitals Group     BP 08/21/24 1016 113/77     Girls Systolic BP Percentile --      Girls Diastolic BP Percentile --      Boys Systolic BP Percentile --      Boys Diastolic BP Percentile --      Pulse Rate 08/21/24 1016 (!) 117     Resp 08/21/24 1016 16     Temp 08/21/24 1016 97.6 F (36.4 C)     Temp Source 08/21/24 1016 Oral     SpO2 08/21/24 1016 97 %     Weight 08/21/24 1015 132 lb 15 oz (60.3 kg)     Height --      Head Circumference --       Peak Flow --      Pain Score 08/21/24 1014 0     Pain Loc --      Pain Education --      Exclude from Growth Chart --    No data found.  Updated Vital Signs BP 113/77 (BP Location: Left Arm)   Pulse (!) 117   Temp 97.6 F (36.4 C) (Oral)   Resp 16   Wt 132 lb 15 oz (60.3 kg)   LMP  (LMP Unknown)   SpO2 97%   BMI 23.55 kg/m   Visual Acuity Right Eye Distance:   Left Eye Distance:   Bilateral Distance:    Right Eye Near:   Left Eye Near:    Bilateral Near:     Physical Exam Vitals and nursing note reviewed.  Constitutional:      General: She is not in acute distress.    Appearance: Normal appearance. She is not ill-appearing, toxic-appearing or diaphoretic.  Eyes:     General: No scleral icterus. Cardiovascular:     Rate and Rhythm: Normal rate and regular rhythm.     Heart sounds: Normal heart sounds.  Pulmonary:     Effort: Pulmonary effort is normal. No respiratory distress.     Breath sounds: Normal breath sounds. No wheezing or rhonchi.  Skin:    General: Skin is warm.  Neurological:     Mental Status: She is alert and oriented to person, place, and time.  Psychiatric:        Mood and Affect: Mood normal.        Behavior: Behavior normal.      UC Treatments / Results  Labs (all labs ordered are listed, but only abnormal results are displayed) Labs Reviewed - No data to display  EKG   Radiology No results found.  Procedures Procedures (including critical care time)  Medications Ordered in UC Medications - No  data to display  Initial Impression / Assessment and Plan / UC Course  I have reviewed the triage vital signs and the nursing notes.  Pertinent labs & imaging results that were available during my care of the patient were reviewed by me and considered in my medical decision making (see chart for details).    Final Clinical Impressions(s) / UC Diagnoses   Final diagnoses:  Asthma with acute exacerbation, unspecified asthma severity,  unspecified whether persistent   Discharge Instructions   None    ED Prescriptions     Medication Sig Dispense Auth. Provider   methylPREDNISolone  (MEDROL  DOSEPAK) 4 MG TBPK tablet Take as directed on back of package 21 tablet Andra Corean BROCKS, PA-C   guaiFENesin  (MUCINEX ) 600 MG 12 hr tablet Take 1 tablet (600 mg total) by mouth 2 (two) times daily for 10 days. 20 tablet Andra Corean C, PA-C   promethazine -dextromethorphan (PROMETHAZINE -DM) 6.25-15 MG/5ML syrup Take 5 mLs by mouth 4 (four) times daily as needed for cough. 118 mL Andra Corean BROCKS, PA-C      PDMP not reviewed this encounter.   Andra Corean BROCKS, PA-C 08/21/24 1100

## 2024-08-21 NOTE — ED Triage Notes (Signed)
 Pt presents c/o productive cough x 4 days. Pt states,  I have this cough and my inhalers have not been helping. I was put on an inhaler for nasal drip but it has not helped.  Pt denies any additional sxs.

## 2024-08-22 ENCOUNTER — Ambulatory Visit

## 2024-09-01 ENCOUNTER — Ambulatory Visit (INDEPENDENT_AMBULATORY_CARE_PROVIDER_SITE_OTHER)

## 2024-09-01 ENCOUNTER — Ambulatory Visit: Admission: EM | Admit: 2024-09-01 | Discharge: 2024-09-01 | Disposition: A | Discharge status: Home / Self Care

## 2024-09-01 DIAGNOSIS — R0602 Shortness of breath: Secondary | ICD-10-CM

## 2024-09-01 DIAGNOSIS — R051 Acute cough: Secondary | ICD-10-CM

## 2024-09-01 DIAGNOSIS — J22 Unspecified acute lower respiratory infection: Secondary | ICD-10-CM | POA: Diagnosis not present

## 2024-09-01 MED ORDER — METHYLPREDNISOLONE SODIUM SUCC 125 MG IJ SOLR
80.0000 mg | Freq: Once | INTRAMUSCULAR | Status: AC
  Administered 2024-09-01: 80 mg via INTRAMUSCULAR

## 2024-09-01 MED ORDER — FLUCONAZOLE 150 MG PO TABS: 150.0000 mg | ORAL_TABLET | Freq: Every day | ORAL | 0 refills | Status: AC

## 2024-09-01 MED ORDER — AMOXICILLIN-POT CLAVULANATE 875-125 MG PO TABS: 1.0000 | ORAL_TABLET | Freq: Two times a day (BID) | ORAL | 0 refills | Status: AC

## 2024-09-01 MED ORDER — PROMETHAZINE-DM 6.25-15 MG/5ML PO SYRP: 5.0000 mL | ORAL_SOLUTION | Freq: Three times a day (TID) | ORAL | 0 refills | Status: AC | PRN

## 2024-09-01 MED ORDER — PREDNISONE 50 MG PO TABS: 50.0000 mg | ORAL_TABLET | Freq: Every day | ORAL | 0 refills | Status: AC

## 2024-09-01 NOTE — ED Triage Notes (Signed)
 Patient reports possible bronchitis/pneumonia. Seen for this a few weeks ago which has helped but exposed to another virus which has made her sick again causing a deeper cough. No fever.

## 2024-09-01 NOTE — Discharge Instructions (Addendum)
 Chest x-ray done today.  Final evaluation by the radiologist is still pending but on brief evaluation there may be a developing process on the right.  Once the radiologist has finalized the report, if there is any changes in your treatment plan we will contact you.  We will treat for possible respiratory infection.  We will treat with the following: Medrol  injection given today. This is a steroid to help with inflammation.  Augmentin  875 mg twice daily for 7 days.  This is an antibiotic.  Take this with food.  Start 09/02/24 Prednisone  50 mg once daily for 4 days. Take this in the morning.  This is a steroid to help with inflammation Make sure to stay hydrated by drinking plenty of water .  Return to urgent care or PCP if symptoms worsen or fail to resolve.

## 2024-09-01 NOTE — ED Provider Notes (Signed)
 [facility]    CSN: [id number] Arrival date & time: 09/01/24  1044      History   Chief Complaint Chief Complaint  Patient presents with   Asthma Problem    HPI Ashley Chen is a 23 y.o. female.   23 year old female who presents urgent care with complaints of cough, shortness of breath.  Her symptoms started about 10 days ago.  She did have another viral upper respiratory infection shortly before this and had just finished steroids for this when the current symptoms started.  She reports that her cough is extremely deep.  It is productive at times.  She is having a lot of shortness of breath.  She does have asthma and has been using her asthma medication more frequently.     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   Migraine without status migrainosus, not intractable 03/04/2024   Mild intermittent asthma with exacerbation 03/04/2024   Mild persistent asthma with exacerbation 03/04/2024   Other chronic pain 03/04/2024   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/2024   Seasonal allergies 03/04/2024   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   Anxiety disorder 03/04/2024   Panic disorder 09/06/2018   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   Acute pharyngitis 09/04/2018   Diarrhea 09/04/2018   Nausea and vomiting 09/04/2018   Viral infection of digestive tract 09/04/2018   Attention deficit hyperactivity disorder (ADHD), combined type, 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   Pneumonia 11/01/2017   Moderate malnutrition 10/12/2017   Needle phobia 10/12/2017   Chronic post-traumatic stress disorder  06/11/2015   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   Menorrhagia 08/14/2014    Past Surgical History:  Procedure Laterality Date   FOOT SURGERY     labial surgery     LAPAROSCOPY N/A 10/03/2023   Procedure: LAPAROSCOPY DIAGNOSTIC excision endometiotic implant;  Surgeon: Henry Slough, MD;  Location: [facility];  Service: Gynecology;  Laterality: N/A;   TONSILECTOMY, ADENOIDECTOMY, BILATERAL MYRINGOTOMY AND TUBES     before age 40   TONSILLECTOMY AND ADENOIDECTOMY     umblical hernia repair     before age 27    OB History     Gravida  0   Para  0   Term  0   Preterm  0   AB  0   Living  0      SAB  0   IAB  0   Ectopic  0   Multiple  0   Live Births  0            Home Medications    Prior to Admission medications   Medication Sig Start Date End Date Taking? Authorizing Provider  albuterol  (PROAIR  HFA) 108 (90 Base) MCG/ACT inhaler Inhale 2 puffs into the lungs every 4 (four) hours as needed for wheezing or shortness of breath. 09/04/18  Yes [provider]  albuterol  (PROVENTIL ) (2.5 MG/3ML) 0.083% nebulizer solution Take 2.5 mg by nebulization every 6 (six) hours as needed. 04/13/24  Yes [provider]  albuterol  (VENTOLIN  HFA) 108 (90 Base) MCG/ACT inhaler  Inhale 2 puffs into the lungs every 6 (six) hours as needed for wheezing or shortness of breath. 11/01/14  Yes [provider]  amoxicillin -clavulanate (AUGMENTIN ) 875-125 MG tablet Take 1 tablet by mouth every 12 (twelve) hours. 09/01/24  Yes Genette Huertas A, PA-C  citalopram (CELEXA) 20 MG tablet Take 20 mg by mouth daily. 09/04/18  Yes [provider]  citalopram (CELEXA) 40 MG tablet Take 40 mg by mouth daily. 09/04/18  Yes [provider]  desvenlafaxine  (PRISTIQ ) 25 MG 24 hr tablet Take 25 mg by mouth daily. 09/04/18  Yes [provider]  desvenlafaxine  (PRISTIQ ) 50 MG 24 hr tablet Take 50 mg by mouth daily. 09/04/18  Yes [provider]  HYDROcodone bit-homatropine (HYCODAN) 5-1.5 MG/5ML syrup Take 5 mLs by mouth as directed. 09/04/18  Yes [provider]  levofloxacin (LEVAQUIN) 500 MG tablet Oral 09/04/18  Yes [provider]  lisdexamfetamine (VYVANSE ) 40 MG capsule Take 40 mg by mouth every morning. 09/04/18  Yes [provider]  lisdexamfetamine (VYVANSE ) 50 MG capsule Take 50 mg by mouth daily. 09/04/18  Yes [provider]  metroNIDAZOLE (FLAGYL) 500 MG tablet Take 500 mg by mouth as directed. 09/04/18  Yes [provider]  metroNIDAZOLE (METROGEL) 0.75 % vaginal gel Place 1 Applicatorful vaginally at bedtime. 09/04/18  Yes [provider]  mirtazapine  (REMERON ) 15 MG tablet Take 15 mg by mouth at bedtime. 09/04/18  Yes [provider]  mirtazapine  (REMERON ) 30 MG tablet Take 30 mg by mouth at bedtime. 09/04/18  Yes [provider]  predniSONE  (DELTASONE ) 50 MG tablet Take 1 tablet (50 mg total) by mouth daily with breakfast for 4 days. 09/01/24 09/05/24 Yes Marca Gadsby A, PA-C  promethazine -dextromethorphan (PROMETHAZINE -DM) 6.25-15 MG/5ML syrup Take 5 mLs by mouth every 8 (eight) hours as needed for cough. 09/01/24  Yes Velna Hedgecock A, PA-C  valACYclovir (VALTREX) 500 MG tablet Take 500 mg by mouth as directed. 09/04/18  Yes [provider]  albuterol  (VENTOLIN  HFA) 108 (90 Base) MCG/ACT inhaler Inhale 1-2 puffs into the lungs every 6 (six) hours as needed for wheezing or shortness of breath. 03/27/24   Romelle Booty, MD  ALPRAZolam  (XANAX ) 0.5 MG tablet TAKE 1 TABLET BY MOUTH 3 TIMES DAILY AS NEEDED. 07/17/24   Rhys Boyer T, PA-C  azithromycin (ZITHROMAX) 500 MG tablet Oral 09/04/18   [provider]  budesonide -formoterol  (SYMBICORT ) 160-4.5 MCG/ACT inhaler Inhale 2 puffs into the lungs in the morning and at bedtime. Please take 1-2 Puffs every 6 hours as needed for shortness of breath or worsening asthma symptoms. Do not  exceed 8 puffs a day. 05/10/24   Hattar, Zola SAILOR, MD  buPROPion  (WELLBUTRIN  XL) 300 MG 24 hr tablet Take 1 tablet (300 mg total) by mouth daily. 05/25/24   Teresa Redell LABOR, NP  cetirizine (ZYRTEC) 10 MG tablet Take 10 mg by mouth daily. 08/14/14   [provider]  citalopram (CELEXA) 10 MG tablet Oral 09/04/18   [provider]  clonazePAM  (KLONOPIN ) 0.5 MG tablet Oral 09/04/18   [provider]  cyclobenzaprine (FLEXERIL) 5 MG tablet Take 5 mg by mouth 3 (three) times daily as needed. 03/12/24   [provider]  desvenlafaxine  (PRISTIQ ) 100 MG 24 hr tablet Take 1 tablet (100 mg total) by mouth at bedtime. 05/25/24   Teresa Redell LABOR, NP  EPINEPHrine  0.3 mg/0.3 mL IJ SOAJ injection Inject 0.3 mg into the muscle once as needed (severe allergic reaction). 01/19/22  Teresa Redell LABOR, NP  erythromycin ophthalmic ointment 1 application into the lower eyelid of affected eye Ophthalmic Four times a day; Duration: 7 days 05/09/23   [provider]  hydrOXYzine  (ATARAX ) 10 MG tablet Take 10 mg by mouth daily at 2 PM.    [provider]  hydrOXYzine  (VISTARIL ) 50 MG capsule Take 1 capsule (50 mg total) by mouth at bedtime as needed for anxiety (insomnia; Panic attack). 05/25/24   Teresa Redell LABOR, NP  hydrOXYzine  (VISTARIL ) 50 MG capsule Take 50 mg by mouth.    [provider]  ipratropium (ATROVENT ) 0.06 % nasal spray Place 2 sprays into both nostrils 4 (four) times daily. 08/10/24   Hattar, Zola SAILOR, MD  levalbuterol (XOPENEX) 1.25 MG/3ML nebulizer solution Take 1.25 mg by nebulization every 8 (eight) hours as needed. 08/20/19   [provider]  levofloxacin (LEVAQUIN) 500 MG tablet Oral 09/04/18   [provider]  levonorgestrel  (MIRENA ) 20 MCG/24HR IUD 1 each by Intrauterine route once. Implanted November 2021    [provider]  lisdexamfetamine (VYVANSE ) 30 MG capsule Oral 09/04/18   [provider]  LORazepam  (ATIVAN ) 0.5  MG tablet Oral 09/04/18   [provider]  methylPREDNISolone  (MEDROL  DOSEPAK) 4 MG TBPK tablet Take as directed on back of package 08/21/24   Andra Corean BROCKS, PA-C  montelukast  (SINGULAIR ) 10 MG tablet Take 1 tablet (10 mg total) by mouth at bedtime. 11/16/21   Meade Verdon RAMAN, MD  ondansetron  (ZOFRAN -ODT) 4 MG disintegrating tablet Oral 09/04/18   [provider]  Tiotropium Bromide (SPIRIVA  RESPIMAT) 1.25 MCG/ACT AERS Inhale 2 puffs into the lungs daily. 08/10/24   Hattar, Zola SAILOR, MD  valACYclovir (VALTREX) 1000 MG tablet Take 1,000 mg by mouth daily.    [provider]    Family History Family History  Problem Relation Age of Onset   Drug abuse Mother    Anxiety disorder Mother    ADD / ADHD Brother    Mental retardation Brother    Depression Paternal Uncle    Alcohol abuse Paternal Uncle     Social History Social History   Tobacco Use   Smoking status: Every Day    Types: Cigarettes    Passive exposure: Never   Smokeless tobacco: Never  Vaping Use   Vaping status: Every Day   Substances: Nicotine, Flavoring  Substance Use Topics   Alcohol use: Yes    Alcohol/week: 3.0 standard drinks of alcohol    Types: 3 Shots of liquor per week    Comment: once a month   Drug use: Yes    Types: Marijuana     Allergies   Horse epithelium, Horse-derived products, Other, Azithromycin, and Lactose intolerance (gi)   Review of Systems Review of Systems  Constitutional:  Negative for chills and fever.  HENT:  Positive for congestion. Negative for ear pain and sore throat.   Eyes:  Negative for pain and visual disturbance.  Respiratory:  Positive for cough, chest tightness and shortness of breath.   Cardiovascular:  Negative for chest pain and palpitations.  Gastrointestinal:  Negative for abdominal pain and vomiting.  Genitourinary:  Negative for dysuria and hematuria.  Musculoskeletal:  Negative for arthralgias and back pain.  Skin:  Negative for  color change and rash.  Neurological:  Negative for seizures and syncope.  All other systems reviewed and are negative.    Physical Exam Triage Vital Signs ED Triage Vitals  Encounter Vitals Group     BP  09/01/24 1138 122/80     Girls Systolic BP Percentile --      Girls Diastolic BP Percentile --      Boys Systolic BP Percentile --      Boys Diastolic BP Percentile --      Pulse Rate 09/01/24 1138 88     Resp 09/01/24 1138 20     Temp 09/01/24 1138 97.9 F (36.6 C)     Temp Source 09/01/24 1138 Oral     SpO2 09/01/24 1138 99 %     Weight 09/01/24 1136 132 lb 15 oz (60.3 kg)     Height 09/01/24 1136 5' 3 (1.6 m)     Head Circumference --      Peak Flow --      Pain Score 09/01/24 1134 0     Pain Loc --      Pain Education --      Exclude from Growth Chart --    No data found.  Updated Vital Signs BP 122/80 (BP Location: Left Arm)   Pulse 88   Temp 97.9 F (36.6 C) (Oral)   Resp 20   Ht 5' 3 (1.6 m)   Wt 132 lb 15 oz (60.3 kg)   LMP  (Exact Date)   SpO2 99%   BMI 23.55 kg/m   Visual Acuity Right Eye Distance:   Left Eye Distance:   Bilateral Distance:    Right Eye Near:   Left Eye Near:    Bilateral Near:     Physical Exam Vitals and nursing note reviewed.  Constitutional:      General: She is not in acute distress.    Appearance: She is well-developed.  HENT:     Head: Normocephalic and atraumatic.     Right Ear: Tympanic membrane normal.     Left Ear: Tympanic membrane normal.     Nose: Congestion present.  Eyes:     Conjunctiva/sclera: Conjunctivae normal.  Cardiovascular:     Rate and Rhythm: Normal rate and regular rhythm.     Heart sounds: No murmur heard. Pulmonary:     Effort: Pulmonary effort is normal. No respiratory distress.     Breath sounds: Examination of the right-upper field reveals wheezing. Examination of the left-upper field reveals wheezing. Examination of the right-middle field reveals wheezing and rhonchi. Examination of  the left-middle field reveals wheezing. Examination of the right-lower field reveals rhonchi. Wheezing and rhonchi present. No decreased breath sounds.  Abdominal:     Palpations: Abdomen is soft.     Tenderness: There is no abdominal tenderness.  Musculoskeletal:        General: No swelling.     Cervical back: Neck supple.  Skin:    General: Skin is warm and dry.     Capillary Refill: Capillary refill takes less than 2 seconds.  Neurological:     Mental Status: She is alert.  Psychiatric:        Mood and Affect: Mood normal.      UC Treatments / Results  Labs (all labs ordered are listed, but only abnormal results are displayed) Labs Reviewed - No data to display  EKG   Radiology DG Chest 2 View Result Date: 09/01/2024 EXAM: 2 VIEW(S) XRAY OF THE CHEST 09/01/2024 12:05:50 PM COMPARISON: 07/10/2022 CLINICAL HISTORY: cough, shortness of breath FINDINGS: LUNGS AND PLEURA: No focal pulmonary opacity. No pleural effusion. No pneumothorax. HEART AND MEDIASTINUM: No acute abnormality of the cardiac and mediastinal silhouettes. BONES AND SOFT TISSUES: No  acute osseous abnormality. IMPRESSION: 1. No acute cardiopulmonary process. Electronically signed by: Lynwood Seip MD 09/01/2024 12:32 PM EST RP Workstation: [id number]    Procedures Procedures (including critical care time)  Medications Ordered in UC Medications  methylPREDNISolone  sodium succinate (SOLU-MEDROL ) 125 mg/2 mL injection 80 mg (80 mg Intramuscular Given 09/01/24 1212)    Initial Impression / Assessment and Plan / UC Course  I have reviewed the triage vital signs and the nursing notes.  Pertinent labs & imaging results that were available during my care of the patient were reviewed by me and considered in my medical decision making (see chart for details).     Lower respiratory infection  Acute cough - Plan: DG Chest 2 View, DG Chest 2 View  Shortness of breath - Plan: DG Chest 2 View, DG Chest 2 View   Chest  x-ray done today.  Final evaluation by the radiologist is still pending but on brief evaluation there may be a developing process on the right.  Once the radiologist has finalized the report, if there is any changes in your treatment plan we will contact you.  We will treat for possible respiratory infection.  We will treat with the following: Medrol  injection given today. This is a steroid to help with inflammation.  Augmentin  875 mg twice daily for 7 days.  This is an antibiotic.  Take this with food.  Start 09/02/24 Prednisone  50 mg once daily for 4 days. Take this in the morning.  This is a steroid to help with inflammation Make sure to stay hydrated by drinking plenty of water .  Return to urgent care or PCP if symptoms worsen or fail to resolve.    Final Clinical Impressions(s) / UC Diagnoses   Final diagnoses:  Acute cough  Shortness of breath  Lower respiratory infection     Discharge Instructions      Chest x-ray done today.  Final evaluation by the radiologist is still pending but on brief evaluation there may be a developing process on the right.  Once the radiologist has finalized the report, if there is any changes in your treatment plan we will contact you.  We will treat for possible respiratory infection.  We will treat with the following: Medrol  injection given today. This is a steroid to help with inflammation.  Augmentin  875 mg twice daily for 7 days.  This is an antibiotic.  Take this with food.  Start 09/02/24 Prednisone  50 mg once daily for 4 days. Take this in the morning.  This is a steroid to help with inflammation Make sure to stay hydrated by drinking plenty of water .  Return to urgent care or PCP if symptoms worsen or fail to resolve.       ED Prescriptions     Medication Sig Dispense Auth. Provider   amoxicillin -clavulanate (AUGMENTIN ) 875-125 MG tablet Take 1 tablet by mouth every 12 (twelve) hours. 14 tablet Laelah Siravo A, PA-C    promethazine -dextromethorphan (PROMETHAZINE -DM) 6.25-15 MG/5ML syrup Take 5 mLs by mouth every 8 (eight) hours as needed for cough. 180 mL Linnaea Ahn A, PA-C   predniSONE  (DELTASONE ) 50 MG tablet Take 1 tablet (50 mg total) by mouth daily with breakfast for 4 days. 4 tablet Teresa Almarie LABOR, PA-C      PDMP not reviewed this encounter.   Teresa Almarie LABOR, PA-C 09/01/24 1237

## 2024-09-03 ENCOUNTER — Ambulatory Visit: Admitting: Behavioral Health

## 2024-09-07 ENCOUNTER — Other Ambulatory Visit: Payer: Self-pay

## 2024-09-07 ENCOUNTER — Telehealth: Payer: Self-pay

## 2024-09-07 DIAGNOSIS — J4520 Mild intermittent asthma, uncomplicated: Secondary | ICD-10-CM

## 2024-09-07 MED ORDER — SPIRIVA RESPIMAT 1.25 MCG/ACT IN AERS: 2.0000 | INHALATION_SPRAY | Freq: Every day | RESPIRATORY_TRACT | 6 refills | Status: DC

## 2024-09-07 NOTE — Progress Notes (Signed)
 Spiriva  prescription sent.

## 2024-09-07 NOTE — Telephone Encounter (Signed)
 I called and spoke to Ashley Chen. Ashley Chen informed of Dr Zaida notes and verbalized understanding. NFN

## 2024-09-07 NOTE — Telephone Encounter (Signed)
 Copied from CRM #[id number]. Topic: Clinical - Medical Advice >> Sep 04, 2024 12:02 PM Rilla B wrote: Reason for CRM:  Patient states she went to urgent care and she has pneumonia and wanted to know if there is anything Dr Zaida would suggest that would help. Patient states she is not sleeping well. Denies diff breathing/SOB. Please call patient @ [phone]. Also patient trying the new inhaler and would like a prescription for that.   ATC x1. Went straight to vm- left message to call back.  Pt went to [facility] 12/6 & sample for Spiriva  was given at ov 11/14, pt is requesting rx.   Dr. Zaida please advise

## 2024-09-11 ENCOUNTER — Ambulatory Visit: Admitting: Podiatry

## 2024-09-11 ENCOUNTER — Encounter: Payer: Self-pay | Admitting: Podiatry

## 2024-09-11 VITALS — Ht 63.0 in | Wt 132.9 lb

## 2024-09-11 DIAGNOSIS — D2372 Other benign neoplasm of skin of left lower limb, including hip: Secondary | ICD-10-CM | POA: Diagnosis not present

## 2024-09-11 NOTE — Progress Notes (Signed)
° °  Chief Complaint  Patient presents with   Plantar Warts    Pt is here to f/u on left foot due to plantar warts, she states the foot feels a lot better still a little spot there, but a lot better then before.    Subjective: 23 y.o. female presenting to the office today for follow-up evaluation of a plantar wart to the left heel.  She has noted significant improvement.  She has been applying the OTC salicylic acid   Past Medical History:  Diagnosis Date   Anxiety    Asthma    Depression    Migraine    PONV (postoperative nausea and vomiting)    Seasonal allergies     Past Surgical History:  Procedure Laterality Date   FOOT SURGERY     labial surgery     LAPAROSCOPY N/A 10/03/2023   Procedure: LAPAROSCOPY DIAGNOSTIC excision endometiotic implant;  Surgeon: Henry Slough, MD;  Location: [facility];  Service: Gynecology;  Laterality: N/A;   TONSILECTOMY, ADENOIDECTOMY, BILATERAL MYRINGOTOMY AND TUBES     before age 15   TONSILLECTOMY AND ADENOIDECTOMY     umblical hernia repair     before age 61    Allergies  Allergen Reactions   Horse Epithelium Anaphylaxis and Dermatitis   Horse-Derived Products Anaphylaxis    Reaction to contact with horses  Other Reaction(s): Unknown   Other Other (See Comments), Hives and Itching    Allergy to cats and some dogs - water  eyes - stuffy nose  Cat   Azithromycin Hives    And rapid heart rate  Other Reaction(s): chest pain-per patient   Lactose Intolerance (Gi) Other (See Comments)    constipation     Objective:  Physical Exam General: Alert and oriented x3 in no acute distress  Dermatology: Significant improvement but there continues to be some hyperkeratotic verruca tissue to the plantar aspect of the left heel.   Vascular: Palpable pedal pulses bilaterally. No edema or erythema noted. Capillary refill within normal limits.  Neurological: Grossly intact via light touch  Musculoskeletal Exam: Negative for  any appreciable tenderness on palpation at the keratotic lesion(s) noted. Range of motion within normal limits bilateral. Muscle strength 5/5 in all groups bilateral.  Assessment: 1.  Plantar wart left heel   Plan of Care:  -Patient evaluated - Light excisional debridement of keratoic lesion(s) using a chisel blade was performed without incident.  -Salicylic acid applied with a bandaid -Recommend salicylic acid daily for an additional4 weeks as tolerated under occlusion with a Band-Aid -Return to the clinic PRN  Thresa EMERSON Sar, DPM [facility]  Dr. Thresa EMERSON Sar, DPM    [address] [place], [place] [address]                Office [phone]  Fax [phone]

## 2024-09-18 ENCOUNTER — Other Ambulatory Visit: Payer: Self-pay

## 2024-09-18 DIAGNOSIS — J4520 Mild intermittent asthma, uncomplicated: Secondary | ICD-10-CM

## 2024-09-18 MED ORDER — SPIRIVA RESPIMAT 1.25 MCG/ACT IN AERS: 2.0000 | INHALATION_SPRAY | Freq: Every day | RESPIRATORY_TRACT | 6 refills | Status: AC

## 2024-09-18 NOTE — Telephone Encounter (Signed)
 Copied from CRM #[id number]. Topic: Clinical - Medication Refill >> Sep 18, 2024 12:40 PM Essie A wrote: Medication: Tiotropium Bromide (SPIRIVA  RESPIMAT) 1.25 MCG/ACT AERS   Has the patient contacted their pharmacy? Yes (Agent: If no, request that the patient contact the pharmacy for the refill. If patient does not wish to contact the pharmacy document the reason why and proceed with request.) (Agent: If yes, when and what did the pharmacy advise?)  This is the patient's preferred pharmacy:  [facility] - [place], [place] - [address] [place] [address] Phone: [phone] Fax: [phone]  Is this the correct pharmacy for this prescription? Yes If no, delete pharmacy and type the correct one.   Has the prescription been filled recently? No  Is the patient out of the medication? Yes, only had samples  Has the patient been seen for an appointment in the last year OR does the patient have an upcoming appointment? Yes  Can we respond through MyChart? Yes  Agent: Please be advised that Rx refills may take up to 3 business days. We ask that you follow-up with your pharmacy.

## 2024-09-22 ENCOUNTER — Other Ambulatory Visit: Payer: Self-pay | Admitting: Physician Assistant

## 2024-09-22 DIAGNOSIS — F410 Panic disorder [episodic paroxysmal anxiety] without agoraphobia: Secondary | ICD-10-CM

## 2024-09-22 DIAGNOSIS — F4312 Post-traumatic stress disorder, chronic: Secondary | ICD-10-CM

## 2024-09-22 NOTE — Telephone Encounter (Signed)
 Please call to schedule FU, missed last appt

## 2024-09-28 NOTE — Telephone Encounter (Signed)
 Pt has appt 1/20.  She does want medication sent and confirmed pharmacy is   [facility] - [place], [place] - [address] [place] [address] Phone: [phone]  Fax: [phone]

## 2024-10-16 ENCOUNTER — Encounter: Payer: Self-pay | Admitting: Behavioral Health

## 2024-10-16 ENCOUNTER — Ambulatory Visit: Admitting: Behavioral Health

## 2024-10-16 DIAGNOSIS — F330 Major depressive disorder, recurrent, mild: Secondary | ICD-10-CM

## 2024-10-16 DIAGNOSIS — F4312 Post-traumatic stress disorder, chronic: Secondary | ICD-10-CM | POA: Diagnosis not present

## 2024-10-16 DIAGNOSIS — F410 Panic disorder [episodic paroxysmal anxiety] without agoraphobia: Secondary | ICD-10-CM | POA: Diagnosis not present

## 2024-10-16 DIAGNOSIS — F902 Attention-deficit hyperactivity disorder, combined type: Secondary | ICD-10-CM | POA: Diagnosis not present

## 2024-10-16 DIAGNOSIS — F411 Generalized anxiety disorder: Secondary | ICD-10-CM | POA: Diagnosis not present

## 2024-10-16 MED ORDER — HYDROXYZINE PAMOATE 50 MG PO CAPS: 50.0000 mg | ORAL_CAPSULE | Freq: Every evening | ORAL | 3 refills | Status: AC | PRN

## 2024-10-16 MED ORDER — DESVENLAFAXINE SUCCINATE ER 100 MG PO TB24: 100.0000 mg | ORAL_TABLET | Freq: Every day | ORAL | 3 refills | Status: AC

## 2024-10-16 MED ORDER — ALPRAZOLAM 0.5 MG PO TABS: 0.5000 mg | ORAL_TABLET | Freq: Three times a day (TID) | ORAL | 3 refills | Status: AC | PRN

## 2024-10-16 MED ORDER — BUPROPION HCL ER (XL) 300 MG PO TB24: 300.0000 mg | ORAL_TABLET | Freq: Every day | ORAL | 1 refills | Status: AC

## 2024-10-16 NOTE — Progress Notes (Signed)
 "     [facility]  Patient ID: Ashley Chen,  MRN: [id number]  PCP: Verdia Lombard, MD  Date of Evaluation: 10/16/2024 Time spent:30 minutes  Chief Complaint:  Chief Complaint   Depression; Anxiety; Family Problem; Medication Problem; Medication Refill; Patient Education; Stress; Follow-up     HISTORY/CURRENT STATUS: HPI   Ashley Chen, 24 year old female presents to this office for follow up and medication management.  Still recovering from effects of flu and respiratory symptoms. Doing ok mentally. A lot of extra stress right now with job, auto problems. Taking it one day at at time.  Still engaged.  Wedding next Oct 2026. Has experienced some asthma flair ups. Regimen of prednisone . Doing well with anxiety and depression overall. Does not want to change or adjust her medications today but would like to follow up in 3 months. She has cut back on her vaping. Weight is steady,  Now 140  lbs.  She reports her anxiety today at 4/10 and depression at 2/10.  Is sleeping ok.  Reports no mania, no psychosis. Does not have SI or HI.    Prior Known Psychiatric medication trials: Prozac  Zoloft Wellbutrin  Mirtazapine  (Worked except for excessive weight gain) Klonopin  Buspar Prazosin  Intuniv  Concerta   Individual Medical History/ Review of Systems: Changes? :No   Allergies: Horse epithelium, Horse-derived products, Other, Azithromycin, and Lactose intolerance (gi)  Current Medications: Current Medications[1] Medication Side Effects: none  Family Medical/ Social History: Changes? No  MENTAL HEALTH EXAM:  Blood pressure 121/77, pulse 92, weight 140 lb (63.5 kg).Body mass index is 24.8 kg/m.  General Appearance: Casual, Neat, and Well Groomed  Eye Contact:  Good  Speech:  Clear and Coherent  Volume:  Normal  Mood:  Angry  Affect:  Appropriate  Thought Process:  Coherent  Orientation:  Full (Time, Place, and Person)  Thought Content: Logical   Suicidal Thoughts:   No  Homicidal Thoughts:  No  Memory:  WNL  Judgement:  Good  Insight:  Good  Psychomotor Activity:  Normal  Concentration:  Concentration: Good  Recall:  Good  Fund of Knowledge: Good  Language: Good  Assets:  Desire for Improvement  ADL's:  Intact  Cognition: WNL  Prognosis:  Good    DIAGNOSES:    ICD-10-CM   1. Chronic post-traumatic stress disorder  F43.12 hydrOXYzine  (VISTARIL ) 50 MG capsule    ALPRAZolam  (XANAX ) 0.5 MG tablet    desvenlafaxine  (PRISTIQ ) 100 MG 24 hr tablet    buPROPion  (WELLBUTRIN  XL) 300 MG 24 hr tablet    2. Panic disorder  F41.0 hydrOXYzine  (VISTARIL ) 50 MG capsule    ALPRAZolam  (XANAX ) 0.5 MG tablet    desvenlafaxine  (PRISTIQ ) 100 MG 24 hr tablet    3. Attention deficit hyperactivity disorder (ADHD), combined type, moderate  F90.2 desvenlafaxine  (PRISTIQ ) 100 MG 24 hr tablet    buPROPion  (WELLBUTRIN  XL) 300 MG 24 hr tablet    4. Generalized anxiety disorder  F41.1 buPROPion  (WELLBUTRIN  XL) 300 MG 24 hr tablet    5. Mild episode of recurrent major depressive disorder  F33.0 buPROPion  (WELLBUTRIN  XL) 300 MG 24 hr tablet      Receiving Psychotherapy: No    RECOMMENDATIONS:  Continue Xanax  0.5 mg 3 times daily prn for panic attacks Stopped  Mirtazapine  15 mg at bedtime Continue Pristiq  100 mg at bedtime Continue hydroxyzine  50 mg three times daily prn Continue Wellbutrin  to 300 mg XL daily To report any severe side effects promptly Provided emergency contact and after hour  information To follow up in 3 months for reassessment per pt   Greater than 50%  of 30 min. face to face time with patient was spent on counseling and coordination of care.  Some anxious distress today related to situational problems.  Discussed family and work dynamics.  She is practicing good coping mechanisms.  Believes that medications continue to work well.  No changes to medication regimen recommended at this time. Refills escribed to patients pharmacy. Refills provided  today PDMP reviewed.  [name], NP     [1]  Current Outpatient Medications:    albuterol  (PROAIR  HFA) 108 (90 Base) MCG/ACT inhaler, Inhale 2 puffs into the lungs every 4 (four) hours as needed for wheezing or shortness of breath., Disp: , Rfl:    albuterol  (PROVENTIL ) (2.5 MG/3ML) 0.083% nebulizer solution, Take 2.5 mg by nebulization every 6 (six) hours as needed., Disp: , Rfl:    albuterol  (VENTOLIN  HFA) 108 (90 Base) MCG/ACT inhaler, Inhale 1-2 puffs into the lungs every 6 (six) hours as needed for wheezing or shortness of breath., Disp: 6.7 g, Rfl: 0   albuterol  (VENTOLIN  HFA) 108 (90 Base) MCG/ACT inhaler, Inhale 2 puffs into the lungs every 6 (six) hours as needed for wheezing or shortness of breath., Disp: , Rfl:    ALPRAZolam  (XANAX ) 0.5 MG tablet, Take 1 tablet (0.5 mg total) by mouth 3 (three) times daily as needed., Disp: 90 tablet, Rfl: 3   amoxicillin -clavulanate (AUGMENTIN ) 875-125 MG tablet, Take 1 tablet by mouth every 12 (twelve) hours., Disp: 14 tablet, Rfl: 0   azithromycin (ZITHROMAX) 500 MG tablet, Oral, Disp: , Rfl:    budesonide -formoterol  (SYMBICORT ) 160-4.5 MCG/ACT inhaler, Inhale 2 puffs into the lungs in the morning and at bedtime. Please take 1-2 Puffs every 6 hours as needed for shortness of breath or worsening asthma symptoms. Do not exceed 8 puffs a day., Disp: 10.2 g, Rfl: 6   buPROPion  (WELLBUTRIN  XL) 300 MG 24 hr tablet, Take 1 tablet (300 mg total) by mouth daily., Disp: 90 tablet, Rfl: 1   cetirizine (ZYRTEC) 10 MG tablet, Take 10 mg by mouth daily., Disp: , Rfl:    citalopram (CELEXA) 10 MG tablet, Oral, Disp: , Rfl:    cyclobenzaprine (FLEXERIL) 5 MG tablet, Take 5 mg by mouth 3 (three) times daily as needed., Disp: , Rfl:    desvenlafaxine  (PRISTIQ ) 100 MG 24 hr tablet, Take 1 tablet (100 mg total) by mouth at bedtime., Disp: 90 tablet, Rfl: 3   EPINEPHrine  0.3 mg/0.3 mL IJ SOAJ injection, Inject 0.3 mg into the muscle once as needed (severe allergic  reaction)., Disp: 1 each, Rfl: 1   erythromycin ophthalmic ointment, 1 application into the lower eyelid of affected eye Ophthalmic Four times a day; Duration: 7 days, Disp: , Rfl:    HYDROcodone bit-homatropine (HYCODAN) 5-1.5 MG/5ML syrup, Take 5 mLs by mouth as directed., Disp: , Rfl:    hydrOXYzine  (VISTARIL ) 50 MG capsule, Take 1 capsule (50 mg total) by mouth at bedtime as needed for anxiety (insomnia; Panic attack)., Disp: 90 capsule, Rfl: 3   ipratropium (ATROVENT ) 0.06 % nasal spray, Place 2 sprays into both nostrils 4 (four) times daily., Disp: 15 mL, Rfl: 12   levalbuterol (XOPENEX) 1.25 MG/3ML nebulizer solution, Take 1.25 mg by nebulization every 8 (eight) hours as needed., Disp: , Rfl:    levofloxacin (LEVAQUIN) 500 MG tablet, Oral, Disp: , Rfl:    levofloxacin (LEVAQUIN) 500 MG tablet, Oral, Disp: , Rfl:  levonorgestrel  (MIRENA ) 20 MCG/24HR IUD, 1 each by Intrauterine route once. Implanted November 2021, Disp: , Rfl:    lisdexamfetamine  (VYVANSE ) 30 MG capsule, Oral, Disp: , Rfl:    lisdexamfetamine  (VYVANSE ) 40 MG capsule, Take 40 mg by mouth every morning., Disp: , Rfl:    lisdexamfetamine  (VYVANSE ) 50 MG capsule, Take 50 mg by mouth daily., Disp: , Rfl:    LORazepam  (ATIVAN ) 0.5 MG tablet, Oral, Disp: , Rfl:    methylPREDNISolone  (MEDROL  DOSEPAK) 4 MG TBPK tablet, Take as directed on back of package, Disp: 21 tablet, Rfl: 0   metroNIDAZOLE (FLAGYL) 500 MG tablet, Take 500 mg by mouth as directed., Disp: , Rfl:    metroNIDAZOLE (METROGEL) 0.75 % vaginal gel, Place 1 Applicatorful vaginally at bedtime., Disp: , Rfl:    montelukast  (SINGULAIR ) 10 MG tablet, Take 1 tablet (10 mg total) by mouth at bedtime., Disp: 30 tablet, Rfl: 11   ondansetron  (ZOFRAN -ODT) 4 MG disintegrating tablet, Oral, Disp: , Rfl:    promethazine -dextromethorphan (PROMETHAZINE -DM) 6.25-15 MG/5ML syrup, Take 5 mLs by mouth every 8 (eight) hours as needed for cough., Disp: 180 mL, Rfl: 0   Tiotropium Bromide  (SPIRIVA  RESPIMAT) 1.25 MCG/ACT AERS, Inhale 2 puffs into the lungs daily., Disp: 4 g, Rfl: 6   valACYclovir (VALTREX) 1000 MG tablet, Take 1,000 mg by mouth daily., Disp: , Rfl:    valACYclovir (VALTREX) 500 MG tablet, Take 500 mg by mouth as directed., Disp: , Rfl:   "

## 2025-01-14 ENCOUNTER — Ambulatory Visit: Admitting: Behavioral Health
